# Patient Record
Sex: Female | Born: 1980 | Race: White | Hispanic: No | Marital: Married | Smoking: Former smoker
Health system: Southern US, Community
[De-identification: ages and names within clinical notes are randomized; demographics above are authoritative.]

## PROBLEM LIST (undated history)

## (undated) DIAGNOSIS — K8020 Calculus of gallbladder without cholecystitis without obstruction: Secondary | ICD-10-CM

## (undated) DIAGNOSIS — R109 Unspecified abdominal pain: Secondary | ICD-10-CM

## (undated) HISTORY — DX: Calculus of gallbladder without cholecystitis without obstruction: K80.20

## (undated) HISTORY — DX: Unspecified abdominal pain: R10.9

---

## 2000-10-30 ENCOUNTER — Other Ambulatory Visit: Admission: RE | Admit: 2000-10-30 | Discharge: 2000-10-30 | Payer: Self-pay | Admitting: Family Medicine

## 2003-01-15 ENCOUNTER — Other Ambulatory Visit: Admission: RE | Admit: 2003-01-15 | Discharge: 2003-01-15 | Payer: Self-pay | Admitting: Family Medicine

## 2004-04-28 ENCOUNTER — Other Ambulatory Visit: Admission: RE | Admit: 2004-04-28 | Discharge: 2004-04-28 | Payer: Self-pay | Admitting: Obstetrics and Gynecology

## 2005-11-01 ENCOUNTER — Other Ambulatory Visit: Admission: RE | Admit: 2005-11-01 | Discharge: 2005-11-01 | Payer: Self-pay | Admitting: Obstetrics and Gynecology

## 2006-05-23 ENCOUNTER — Inpatient Hospital Stay (HOSPITAL_COMMUNITY): Admission: AD | Admit: 2006-05-23 | Discharge: 2006-05-27 | Payer: Self-pay | Admitting: Obstetrics and Gynecology

## 2006-05-24 ENCOUNTER — Encounter (INDEPENDENT_AMBULATORY_CARE_PROVIDER_SITE_OTHER): Payer: Self-pay | Admitting: Specialist

## 2006-11-21 ENCOUNTER — Ambulatory Visit: Payer: Self-pay | Admitting: Family Medicine

## 2006-11-22 LAB — CONVERTED CEMR LAB
AST: 21 units/L (ref 0–37)
Albumin: 4.3 g/dL (ref 3.5–5.2)
Alkaline Phosphatase: 47 units/L (ref 39–117)
Basophils Absolute: 0 10*3/uL (ref 0.0–0.1)
Basophils Relative: 0.3 % (ref 0.0–1.0)
CO2: 27 meq/L (ref 19–32)
Chloride: 104 meq/L (ref 96–112)
Creatinine, Ser: 0.8 mg/dL (ref 0.4–1.2)
HCT: 37.3 % (ref 36.0–46.0)
Hemoglobin: 12.5 g/dL (ref 12.0–15.0)
Lipase: 20 units/L (ref 11.0–59.0)
MCHC: 33.6 g/dL (ref 30.0–36.0)
Monocytes Absolute: 0.1 10*3/uL — ABNORMAL LOW (ref 0.2–0.7)
Neutrophils Relative %: 70.6 % (ref 43.0–77.0)
Potassium: 4.1 meq/L (ref 3.5–5.1)
RBC: 4.54 M/uL (ref 3.87–5.11)
RDW: 20.4 % — ABNORMAL HIGH (ref 11.5–14.6)
Sodium: 137 meq/L (ref 135–145)
Total Bilirubin: 1 mg/dL (ref 0.3–1.2)
Total Protein: 7.3 g/dL (ref 6.0–8.3)
WBC: 11 10*3/uL — ABNORMAL HIGH (ref 4.5–10.5)

## 2008-05-11 ENCOUNTER — Inpatient Hospital Stay (HOSPITAL_COMMUNITY): Admission: RE | Admit: 2008-05-11 | Discharge: 2008-05-14 | Payer: Self-pay | Admitting: Obstetrics and Gynecology

## 2008-05-15 ENCOUNTER — Encounter: Admission: RE | Admit: 2008-05-15 | Discharge: 2008-06-16 | Payer: Self-pay | Admitting: Obstetrics and Gynecology

## 2009-08-02 ENCOUNTER — Ambulatory Visit: Payer: Self-pay | Admitting: Family Medicine

## 2009-08-02 DIAGNOSIS — R109 Unspecified abdominal pain: Secondary | ICD-10-CM | POA: Insufficient documentation

## 2009-08-03 LAB — CONVERTED CEMR LAB
Albumin: 4 g/dL (ref 3.5–5.2)
Basophils Absolute: 0 10*3/uL (ref 0.0–0.1)
CO2: 29 meq/L (ref 19–32)
Calcium: 9 mg/dL (ref 8.4–10.5)
Chloride: 104 meq/L (ref 96–112)
Eosinophils Absolute: 0.3 10*3/uL (ref 0.0–0.7)
HCT: 38.9 % (ref 36.0–46.0)
Hemoglobin: 13.4 g/dL (ref 12.0–15.0)
Lymphs Abs: 2.9 10*3/uL (ref 0.7–4.0)
MCHC: 34.4 g/dL (ref 30.0–36.0)
Monocytes Absolute: 0.9 10*3/uL (ref 0.1–1.0)
Neutro Abs: 3.3 10*3/uL (ref 1.4–7.7)
Platelets: 327 10*3/uL (ref 150.0–400.0)
Potassium: 3.9 meq/L (ref 3.5–5.1)
RDW: 11.9 % (ref 11.5–14.6)
Sodium: 140 meq/L (ref 135–145)
Total Bilirubin: 1.9 mg/dL — ABNORMAL HIGH (ref 0.3–1.2)

## 2009-08-05 ENCOUNTER — Encounter: Payer: Self-pay | Admitting: Family Medicine

## 2009-08-09 ENCOUNTER — Telehealth: Payer: Self-pay | Admitting: Family Medicine

## 2009-08-10 ENCOUNTER — Encounter: Payer: Self-pay | Admitting: Family Medicine

## 2009-08-11 ENCOUNTER — Encounter (INDEPENDENT_AMBULATORY_CARE_PROVIDER_SITE_OTHER): Payer: Self-pay | Admitting: *Deleted

## 2009-08-11 ENCOUNTER — Encounter: Payer: Self-pay | Admitting: Family Medicine

## 2009-09-08 ENCOUNTER — Ambulatory Visit: Payer: Self-pay | Admitting: Gastroenterology

## 2009-09-08 DIAGNOSIS — K8020 Calculus of gallbladder without cholecystitis without obstruction: Secondary | ICD-10-CM | POA: Insufficient documentation

## 2009-10-20 ENCOUNTER — Encounter: Payer: Self-pay | Admitting: Family Medicine

## 2010-10-25 NOTE — Consult Note (Signed)
Summary: [facility]   Imported By: Lanelle Bal 11/02/2009 11:51:57  _____________________________________________________________________  External Attachment:    Type:   Image     Comment:   External Document

## 2011-01-30 ENCOUNTER — Emergency Department (HOSPITAL_COMMUNITY): Payer: Self-pay

## 2011-01-30 ENCOUNTER — Emergency Department (HOSPITAL_COMMUNITY)
Admission: EM | Admit: 2011-01-30 | Discharge: 2011-01-30 | Disposition: A | Discharge status: Home / Self Care | Payer: Self-pay | Attending: Emergency Medicine | Admitting: Emergency Medicine

## 2011-01-30 DIAGNOSIS — R10816 Epigastric abdominal tenderness: Secondary | ICD-10-CM | POA: Insufficient documentation

## 2011-01-30 DIAGNOSIS — K2990 Gastroduodenitis, unspecified, without bleeding: Secondary | ICD-10-CM | POA: Insufficient documentation

## 2011-01-30 DIAGNOSIS — K2970 Gastritis, unspecified, without bleeding: Secondary | ICD-10-CM | POA: Insufficient documentation

## 2011-01-30 DIAGNOSIS — R1013 Epigastric pain: Secondary | ICD-10-CM | POA: Insufficient documentation

## 2011-01-30 DIAGNOSIS — R112 Nausea with vomiting, unspecified: Secondary | ICD-10-CM | POA: Insufficient documentation

## 2011-01-30 DIAGNOSIS — E039 Hypothyroidism, unspecified: Secondary | ICD-10-CM | POA: Insufficient documentation

## 2011-01-30 LAB — COMPREHENSIVE METABOLIC PANEL
Alkaline Phosphatase: 63 U/L (ref 39–117)
BUN: 8 mg/dL (ref 6–23)
Chloride: 101 mEq/L (ref 96–112)
GFR calc non Af Amer: >60 mL/min (ref >60)
Glucose, Bld: 104 mg/dL — ABNORMAL HIGH (ref 70–99)
Potassium: 3.9 mEq/L (ref 3.5–5.1)
Total Bilirubin: 0.3 mg/dL (ref 0.3–1.2)
Total Protein: 7.3 g/dL (ref 6.0–8.3)

## 2011-01-30 LAB — LIPASE, BLOOD: Lipase: 24 U/L (ref 11–59)

## 2011-01-30 LAB — CBC
HCT: 39.5 % (ref 36.0–46.0)
Hemoglobin: 13.2 g/dL (ref 12.0–15.0)
MCH: 30.8 pg (ref 26.0–34.0)
RBC: 4.28 MIL/uL (ref 3.87–5.11)

## 2011-01-30 LAB — DIFFERENTIAL
Basophils Relative: 0 % (ref 0–1)
Lymphocytes Relative: 28 % (ref 12–46)
Lymphs Abs: 4.4 10*3/uL — ABNORMAL HIGH (ref 0.7–4.0)
Monocytes Relative: 8 % (ref 3–12)
Neutro Abs: 9.4 10*3/uL — ABNORMAL HIGH (ref 1.7–7.7)
Neutrophils Relative %: 59 % (ref 43–77)

## 2011-02-07 NOTE — Op Note (Signed)
[name], [name]              ACCOUNT NO.:  [id number]   MEDICAL RECORD NO.:  [id number]          PATIENT TYPE:  INP   LOCATION:  9109                          FACILITY:  [facility]   PHYSICIAN:  Zenaida Niece, M.D.DATE OF BIRTH:  11/16/1980   DATE OF PROCEDURE:  05/11/2008  DATE OF DISCHARGE:                               OPERATIVE REPORT   PREOPERATIVE DIAGNOSIS:  Intrauterine pregnancy at 39 plus weeks,  previous cesarean section.   POSTOPERATIVE DIAGNOSIS:  Intrauterine pregnancy at 39 plus weeks,  previous cesarean section.   PROCEDURE:  Repeat low-transverse cesarean section without extensions.   SURGEON:  Zenaida Niece, MD   ASSISTANT:  Sherron Monday, MD   ANESTHESIA:  Spinal.   FINDINGS:  She had normal gravid anatomy and delivered a viable female  infant with Apgar's of 9 and 9, and weighed 8 pounds 11 ounces.   SPECIMENS:  Placenta sent for cord blood collection and to labor and  delivery.   ESTIMATED BLOOD LOSS:  800 mL.   COMPLICATIONS:  None.   PROCEDURE IN DETAIL:  The patient was taken to the operating room and  placed in the sitting position.  Dr. Pamalee Leyden instilled spinal anesthesia.  She was then placed in the dorsal supine position with a left lateral  tilt.  Abdomen was prepped and draped in the usual sterile fashion and a  Foley catheter was inserted.  Once the level of her anesthesia was found  to be adequate, abdomen was entered via a standard Pfannenstiel incision  through her previous transverse scar.  Once the peritoneum was entered  and the uterus was exposed, the Alexis disposable self-retaining  retractor was placed.  Good visualization was achieved.  A 4-cm  transverse incision was then made in the lower uterine segment pushing  the bladder inferior.  Once the uterine cavity was entered, the incision  was extended digitally.  Membranes ruptured during the incision  revealing clear fluid.  The fetal vertex was grasped and delivered  through  the incision atraumatically.  Mouth and nares were suctioned.  The remainder of the infant then delivered atraumatically.  Cord was  doubly clamped and cut and the infant handed to the awaiting pediatric  team.  Placenta delivered partially spontaneously, partially manually  and was sent for cord blood collection then to labor and delivery.  The  uterus was wiped dry with a clean lap pad and all clots and debris  removed.  Uterine incision was inspected and found to be free of  extensions.  The uterine incision was then closed in 2 layers with the  first layer being a running locking layer with #1 chromic and the second  layer being an imbricating layer also with #1 chromic.  Tubes and  ovaries were inspected and found to be normal.  The uterine incision was  again inspected and found to be hemostatic.  Bleeding from serosal edges  was controlled with electrocautery.  The subfascial space was then  irrigated and made hemostatic with electrocautery.  The fascia was  closed in a running fashion starting at both ends and meeting  in the  middle with 0 Vicryl.  Subcutaneous tissue was then irrigated and made  hemostatic with  electrocautery.  Subcutaneous tissue was closed with running 2-0 plain  gut suture.  The skin was then closed with staples followed by sterile  dressing.  The patient tolerated the procedure well.  She was taken to  the recovery room in stable condition.  Counts were correct x2 and she  received Ancef 2 g IV prior to incision.      Zenaida Niece, M.D.  Electronically Signed     TDM/MEDQ  D:  05/11/2008  T:  05/12/2008  Job:  [id number]

## 2011-02-07 NOTE — H&P (Signed)
[name], [name]              ACCOUNT NO.:  [id number]   MEDICAL RECORD NO.:  [id number]         PATIENT TYPE:  WINP   LOCATION:                                [facility]  [facility]   PHYSICIAN:  Zenaida Niece, M.D.DATE OF BIRTH:  1981/08/26   DATE OF ADMISSION:  05/11/2008  DATE OF DISCHARGE:                              HISTORY & PHYSICAL   CHIEF COMPLAINT:  Intrauterine pregnancy for repeat cesarean section.   HISTORY OF PRESENT ILLNESS:  This is a 30 year old white female gravida  2, para 1-0-0-1 with an EGA of [redacted] weeks by an LMP consistent with a 9  week ultrasound with a due date of August 19 who is presenting for  repeat cesarean section.  First delivery was via primary cesarean  section at 40 weeks for arrest of descent.  She is cleared for a trial  of labor.  She has signed a VBAC consent form in case she goes into  labor but if she makes it to the 17th she does want to proceed with  repeat cesarean section.  Prenatal care has been otherwise essentially  uncomplicated.   PRENATAL LABS:  Blood type is A+ with a negative antibody screen.  RPR  nonreactive, rubella immune, hepatitis B surface antigen negative, HIV  negative, gonorrhea and chlamydia negative.  Cystic fibrosis negative.  Quad screen is normal.  One hour Glucola is 117.  Group B strep is  negative.   PAST OB HISTORY:  In 2007 low transverse cesarean section by Dr. Ambrose Mantle  at 40 weeks for arrest of descent.  The baby weighed 8 pounds 14 ounces.   PAST MEDICAL HISTORY:  History of depression when she was younger with  no recent medications.   PAST SURGICAL HISTORY:  Only significant for the cesarean section.   ALLERGIES:  MINOCYCLINE.   CURRENT MEDICATIONS:  None.   SOCIAL HISTORY:  She is married and denies alcohol, tobacco or drug use.   FAMILY HISTORY:  Is noncontributory.   REVIEW OF SYSTEMS:  Normal complaints of pregnancy.   HISTORY AND PHYSICAL:  VITAL SIGNS:  Weight is 190 pounds, blood  pressure is 110/60.  GENERAL:  This is a well-developed gravid female in no acute distress.  NECK:  Neck is supple without lymphadenopathy or thyromegaly.  LUNGS:  Clear to auscultation.  HEART:  Regular rate and rhythm without murmur.  ABDOMEN:  Is gravid with a fundal height of 40 cm, nontender and a well-  healed transverse scar.  EXTREMITIES:  1+ edema and are nontender.  CERVIX:  Is 3, 40, -2 to -3 vertex presentation and an adequate pelvis.   ASSESSMENT:  Intrauterine pregnancy at 39 weeks with previous cesarean  section.  The patient is willing to have a VBAC if she goes into labor  but if she makes it to the 17th she wants to proceed with repeat  cesarean section.  All risks of surgery have been discussed with the  patient and she understands.  The plan is to admit the patient on August  17 for repeat cesarean section.      Tawanna Cooler  D. Meisinger, M.D.  Electronically Signed     TDM/MEDQ  D:  05/09/2008  T:  05/09/2008  Job:  [id number]

## 2011-02-07 NOTE — Discharge Summary (Signed)
[name], [name]              ACCOUNT NO.:  [id number]   MEDICAL RECORD NO.:  [id number]          PATIENT TYPE:  INP   LOCATION:  9109                          [facility]   PHYSICIAN:  Zenaida Niece, M.D.DATE OF BIRTH:  12-31-1980   DATE OF ADMISSION:  05/11/2008  DATE OF DISCHARGE:  05/14/2008                               DISCHARGE SUMMARY   ADMISSION DIAGNOSES:  1. Intrauterine pregnancy at 39 weeks.  2. Previous cesarean section.   DISCHARGE DIAGNOSES:  1. Intrauterine pregnancy at 39 weeks.  2. Previous cesarean section.   PROCEDURES:  On May 11, 2008, she had a repeat low transverse  cesarean section.   HISTORY AND PHYSICAL:  Please see chart for full history and physical.  Briefly, this is a 31 year old white female gravida 2, para 1-0-0-1 with  an EGA of [redacted] weeks who presents for repeat cesarean section.  Prenatal  care was uncomplicated.  Past OB history is significant for a low  transverse cesarean section for arrest of descent.  Physical exam is  significant for a gravid abdomen with a well-healed transverse scar and  cervix is 3, 40, -2 to -3 with vertex presentation.   HOSPITAL COURSE:  The patient was admitted on the day of surgery and  underwent repeat low transverse cesarean section under spinal  anesthesia.  She had normal anatomy and delivered a viable female infant  with Apgars of 9 and 9, weight 8 pounds 11 ounces.  Blood loss was 800  mL.  Postoperatively, she had no significant complications.  Pre-  delivery hemoglobin 10.6, postdelivery was 9.8.  On postoperative #3,  she was felt to be stable enough for discharge home.  Prior to discharge  her staples were removed and Steri-Strips applied.   DISCHARGE INSTRUCTIONS:  Regular diet, pelvic rest, no strenuous  activity.  Followup is in 2 weeks for an incision check.  Medications  are Percocet #40 1-2 p.o. q.4-6 hours p.r.n. pain and over-the-counter  ibuprofen as needed and she is given our  discharge pamphlet.      Zenaida Niece, M.D.  Electronically Signed     TDM/MEDQ  D:  05/14/2008  T:  05/14/2008  Job:  [id number]

## 2011-02-08 ENCOUNTER — Encounter: Payer: Self-pay | Admitting: Family Medicine

## 2011-02-10 ENCOUNTER — Ambulatory Visit (INDEPENDENT_AMBULATORY_CARE_PROVIDER_SITE_OTHER): Payer: Self-pay | Admitting: Family Medicine

## 2011-02-10 VITALS — BP 100/70 | HR 71 | Temp 97.7°F | Ht 67.0 in | Wt 194.8 lb

## 2011-02-10 DIAGNOSIS — R109 Unspecified abdominal pain: Secondary | ICD-10-CM

## 2011-02-10 DIAGNOSIS — K8020 Calculus of gallbladder without cholecystitis without obstruction: Secondary | ICD-10-CM

## 2011-02-10 NOTE — Assessment & Plan Note (Signed)
[facility]                                 ON-CALL NOTE   NAME:[name], [name]                       MRN:          [id number]  DATE:07/11/2007                            DOB:          03-01-1981    TIME:  5:23 p.m.   PHONE NUMBER:  [id number].   OBJECTIVE:  Patient had a sore throat all day.  Had some bumps on the  back of her tongue, was wondering what they might be.  Her throat is not  terribly sore.  She sounds fine.   ASSESSMENT:  Upper respiratory infection.   PLAN:  Suggest she gargle as much as possible with salt water.  She is  seeing her taste buds, presumably at the back of her tongue.  Use  Tylenol regularly as needed and drinks lots of fluids.  Call Monday if  no better.   PRIMARY CARE PHYSICIAN:  Kerby Nora, M.D.   HOME OFFICE:  [place].     Arta Silence, MD  Electronically Signed    RNS/MedQ  DD: 07/11/2007  DT: 07/12/2007  Job #: [id number]

## 2011-02-10 NOTE — Assessment & Plan Note (Signed)
[facility]                           [place] OFFICE NOTE   NAME:[name], [name]                     MRN:          [id number]  DATE:11/21/2006                            DOB:          12-Oct-1980    CHIEF COMPLAINT:  A 30 year old white female here to reestablish.   HISTORY OF PRESENT ILLNESS:  Ms. Rowley was last seen at our clinic in  2004 by Dr. Milinda Antis.  In the meantime she has been cared for by her  gynecologist following a delivery of her baby in August 2007 via C-  section.  She comes to clinic today with the following concerns:  1. Irritability:  She states that over the last 2 months since she      began staying at home taking care of her child full-time she has      had an increase of frustration, irritability, fatigue, decreased      energy, and anhedonia.  She states that she does feel sad but is      not tearful.  She states she is having difficulty with the      decreased interaction between herself and adults, including her      husband.  She has no desire to get out and exercise or be active in      children's groups.  She does have a history of depression in 2004      and was treated with Prozac for a year.  She states that she thinks      this medication helped some and does not remember any specific side      effects.  She denies suicidal or homicidal ideation.  She denies      previous hospitalizations for psychiatric issues.  She denies manic      symptoms, hallucinations.  The patient states that her symptoms are      worse 2 weeks prior to her menstrual cycle and then seem to be      relieved somewhat with her menses.  2. Epigastric abdominal pain, chronic:  She states that for about 6      months she has been having pain in her epigastric abdominal area      that radiates through to her back.  It has gradually been      increasing frequency to occurring about once every 2 weeks.  When      she experiences the pain, it  seems to start after fatty foods.  Her      last episode was 5 days ago.  It ranges between 8 and 10 on a scale      of 10.  She also has nausea.  She denies vomiting, constipation,      diarrhea, blood in her stools, fever and chills.  She mentioned her      symptoms to her gynecologist, but at that time it had seemed to be      more of back pain.  She was given Darvocet which she uses for the      pain which helps dull the ache some.  She  denies muscle weakness,      numbness, or low back pain.   REVIEW OF SYSTEMS:  Otherwise negative.   PAST MEDICAL HISTORY:  1. Acne.  2. Depression.   HOSPITALIZATIONS, SURGERIES, PROCEDURES:  August 2007 - C-section.   ALLERGIES:  1. MINOCYCLINE.  2. SEPTRA.   MEDICATIONS:  1. Iron supplement.  2. Prenatal vitamins.  3. Darvocet p.r.n.   SOCIAL HISTORY:  She has a former history of smoking, 10 pack-years.  She does not drink alcohol.  She does not use drugs.  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  She  has been married since April 2007.  She denies any verbal or physical  abuse.  She has one child.  She does not get regular exercise.  She  tries to eat three meals per day including fruits and vegetables, no  fast food, and occasional water.   FAMILY HISTORY:  Father alive at age 78 but this is her adopted father.  Mother alive at age 62 with hypothyroidism.  No family history of heart  attack before age 43.  She has two half-brothers who are healthy.  There  is no family history of cancer in the family as far as she knows.   PHYSICAL EXAMINATION:  VITAL SIGNS:  Height 67 inches, weight 148,  making BMI 21.  Blood pressure 102/60, pulse 76, temperature 98.1.  GENERAL:  Healthy-appearing female in no apparent distress.  PSYCHIATRIC:  Appropriate affect.  No suicidal or homicidal ideation.  Good judgment.  HEENT:  PERRLA, extraocular muscles intact, oropharynx clear, tympanic  membranes  clear, nares clear.  No thyromegaly, no lymphadenopathy  supraclavicular or cervical.  CARDIOVASCULAR:  Regular rate and rhythm.  No murmurs, rubs or gallops.  Normal PMI, 2+ peripheral pulses, no peripheral edema.  LUNGS:  Clear to auscultation bilaterally.  No wheezes, rales, or  rhonchi.  ABDOMEN:  Soft, tender to palpitation in right upper quadrant and  epigastric abdomen.  No rebound, no guarding.  SKIN:  No rash.  MUSCULOSKELETAL:  Strength 5/5 in upper and lower extremities.  BACK:  Nontender to palpation.  Full range of motion in back, flexion  and extension, as well as lateral rotation.  NEUROLOGIC:  Cranial nerves II-XII grossly intact.  Sensation intact in  upper and lower extremities.  Patellar reflexes 2+ bilaterally.   ASSESSMENT AND PLAN:  1. Minor depression versus premenstrual dysphoric disorder:  We      discussed the benefits of becoming more active and being involved      in activities with adults outside of the house.  I also encouraged      her to get exercise and have time for herself.  I offered a      therapist, which she refused at this point in time.  I will check a      thyroid, given her mother's history of hypothyroidism.  She will      begin sertraline 50 mg daily and will return in 3-4 weeks for      followup.  2. Epigastric abdominal pain, chronic:  This may be secondary to      gastritis versus pancreatitis versus gallbladder disease.  Will      begin by checking LFTs and a lipase.  If those are negative, we      will move on to getting a right upper quadrant ultrasound to      evaluate her  gallbladder.  She was given prescription for Darvocet #15 with zero      refills.  She was told      that if her pain lasts more than 3 hours or if she has fever,      nausea, and vomiting with it to go to the emergency room.     Kerby Nora, MD  Electronically Signed   AB/MedQ  DD: 11/22/2006  DT: 11/22/2006  Job #: [id number]

## 2011-02-10 NOTE — H&P (Signed)
[name], [name]              ACCOUNT NO.:  [id number]   MEDICAL RECORD NO.:  [id number]          PATIENT TYPE:  INP   LOCATION:  9169                          [facility]ACILITY:  [facility]   PHYSICIAN:  Malachi Pro. Ambrose Mantle, M.D. DATE OF BIRTH:  [birth year]   DATE OF ADMISSION:  05/23/2006  DATE OF DISCHARGE:                                HISTORY & PHYSICAL   HISTORY OF PRESENT ILLNESS:  [age]-year-old white female, para 0,  gravida 1, EDC of May 22, 2006, admitted with rupture of membranes at 2  p.m. on May 23, 2006.  Blood group and type A positive, negative  antibody.  Nonreactive serology.  Rubella immune.  Hepatitis B surface  antigen negative.  HIV negative.  GC and Chlamydia negative.  Quad screen  normal.  One-hour Glucola 63, three-hour GTT 71, 126, 126 and 114.  Group B  strep negative.  Vaginal ultrasound on October 18, 2005:  Crown-rump length  2.44 cm, 9 weeks 1 day, [facility] May 22, 2006.  Ultrasound on December 12, 2005:  Size compatible with dates.  Placenta previa was seen.  Ultrasound on March 26, 2006 showed resolution of the placenta previa.  The patient had  spontaneous rupture of membranes at 2 p.m. on the day of admission.  She was  admitted and placed on Pitocin.  At 6:56 p.m., the contractions were every 3  minutes on 7 mU/min of Pitocin.   ALLERGIES:  MINOCYCLINE.   PAST MEDICAL HISTORY:   OPERATIONS:  None.   ILLNESSES:  Depression.   SOCIAL HISTORY:  Alcohol, tobacco and drugs:  None.   FAMILY HISTORY:  Mother with thyroid dysfunction.  Paternal grandfather with  pulmonary disease and paternal grandmother with diabetes.   PHYSICAL EXAMINATION:  VITAL SIGNS:  Exam on admission revealed normal vital  signs.  HEART:  Normal.  LUNGS:  Normal.  ABDOMEN:  Soft, fundal height at 38 cm on May 16, 2006.  Fetal heart  tones were normal.  PELVIC:  Cervix 3-4 cm, 80% to 90%, vertex at a -2.   COURSE OF LABOR AND PLAN OF TREATMENT:  The patient had rupture  of  membranes, but membranes present in front of the head, so an artificial  rupture of membranes was done with clear fluid.  Pitocin was continued; at  10:07 p.m., the Pitocin was at 8 mU/min, contractions every 2-3 minutes.  The cervix was 8 cm.  The patient had an epidural.  At 11:39 p.m., the  patient was fully dilated with the vertex OP and vertex at a 0 station in  the occiput-posterior position.  She had become fully dilated at 11:30.  By  2 a.m., she had pushed hard, but she was basically exhausted and was making  poor pushing efforts.  There was a small amount of hair visible and I called  to be  prepared for a C-section, to try a vacuum or forceps delivery, but the  operating room was setting up for another C-section, so I had to call the  second team to proceed to the OR when they are available to try a  vacuum or  forceps and if no descent of the head, to proceed with C-section.      Malachi Pro. Ambrose Mantle, M.D.  Electronically Signed     TFH/MEDQ  D:  05/24/2006  T:  05/24/2006  Job:  [id number]

## 2011-02-10 NOTE — Op Note (Signed)
[name], [name]              ACCOUNT NO.:  [id number]   MEDICAL RECORD NO.:  [id number]          PATIENT TYPE:  INP   LOCATION:  9131                          [facility]   PHYSICIAN:  Malachi Pro. Ambrose Mantle, M.D. DATE OF BIRTH:  10-25-1980   DATE OF PROCEDURE:  05/24/2006  DATE OF DISCHARGE:                                 OPERATIVE REPORT   PREOPERATIVE DIAGNOSIS:  Intrauterine pregnancy 40 weeks, relative  cephalopelvic disproportion.   POSTOPERATIVE DIAGNOSIS:  Intrauterine pregnancy 40 weeks, relative  cephalopelvic disproportion.   OPERATION:  Low transverse cervical cesarean section, oversew of bleeders  from the posterior uterine wall.   OPERATOR:  Malachi Pro. Ambrose Mantle, M.D.   ANESTHESIA:  Epidural anesthesia.   The patient was brought to the operating room and the plan had been to try a  vacuum and, if unsuccessful, proceed with C-section.  The patient had quit  pushing and had lost an inch or two of station of the fetal vertex so we  went straight ahead to C-section.  The patient was brought to the operating  room and the epidural anesthetic was boosted. A Foley catheter was  indwelling. The abdomen was prepped with Betadine solution and draped as a  sterile field. A transverse incision was made and carried in layers through  the skin, subcutaneous tissue, and fascia.  The fascia was separated from  the rectus muscles superiorly and inferiorly.  The rectus muscle was split  in the midline.  The peritoneum was opened vertically.  The bladder was  retracted away and an incision was made into the lower uterine segment  myometrium and then I went the rest of the way with my finger. Clear fluid  was obtained.  There was an abundance of fluid.  I incised the myometrium in  an upward and outward direction both ways.  I then easily delivered the  vertex which was in an occiput posterior position through the incisional  opening. The remainder of the baby was delivered.  The  nose and pharynx was  suctioned with the bulb.  The cord was clamped and the infant was given to  the neonatologist who was in attendance.  It was an 8 pounds 14 ounce  female, given Apgars of 8 at 1 minute and 8 at 5 minutes.  The placenta was  removed after cord blood was obtained and the placenta was preserved for the  cord blood bank people to try to get as much blood as possible. At this  point, the inside of the uterus was inspected, found to be free of debris,  but there was significant bleeding from the posterior uterine wall.  This  was controlled with a combination of 0 Vicryl sutures and 3-0 Vicryl  sutures.  Finally, it seemed to be reasonably controlled and I closed the  uterine incision with two running sutures of 0 Vicryl, locking the first  layer, nonlocking the second layer.  The uterus, both tubes and ovaries  appeared normal.  Liberal irrigation confirmed hemostasis.  Reperitonealization of the bladder flap was not done.  The abdominal wall  was  then closed in layers using interrupted sutures of 0 Vicryl on the  rectus muscle, one of which included the peritoneum, two running sutures of  0 Vicryl on the fascia, running 3-0 Vicryl on the subcu tissue, staples on  the skin.  The patient seemed to tolerate  procedure well.  Blood loss was estimated by the anesthetist at 1200 to 1500  mL. Most of this bleeding came from the posterior uterine wall and the  uterine atony.  The atony was controlled as well as could be with Pitocin,  Methergine and vigorous massage by me.  The patient was returned to recovery  in satisfactory condition.      Malachi Pro. Ambrose Mantle, M.D.  Electronically Signed     TFH/MEDQ  D:  05/24/2006  T:  05/24/2006  Job:  [id number]

## 2011-02-10 NOTE — Patient Instructions (Signed)
Please stop by to see [name] on your way out. 

## 2011-02-10 NOTE — Discharge Summary (Signed)
[name], [name]              ACCOUNT NO.:  [id number]   MEDICAL RECORD NO.:  [id number]          PATIENT TYPE:  INP   LOCATION:  9131                          FACILITY:  [facility]   PHYSICIAN:  Zenaida Niece, M.D.DATE OF BIRTH:  [birth year]   DATE OF ADMISSION:  05/23/2006  DATE OF DISCHARGE:  05/27/2006                                 DISCHARGE SUMMARY   ADMISSION DIAGNOSIS:  Intrauterine pregnancy at 40 weeks, premature rupture  of membranes.   DISCHARGE DIAGNOSIS:  Intrauterine pregnancy at 40 weeks, premature rupture  of membranes, relative CPD.   PROCEDURES:  On August 30, she had a primary low transverse cesarean  section.   HISTORY AND PHYSICAL:  Please see chart for full history and physical.  Briefly, this is a 30 year old white female gravida 1, para 0 with an EGA of  [redacted] weeks who presented to the office with the complaint of spontaneous  rupture of membranes at 1400 on the day of admission.  Evaluation in the  office confirmed rupture of membranes, and she was, I believe, 1 cm dilated.  Prenatal care essentially uncomplicated with a placenta previa early on  ultrasound that resolved.   PRENATAL LABORATORY DATA:  Blood type is A+ with a negative antibody screen,  RPR nonreactive, rubella immune, hepatitis B surface antigen negative, HIV  negative, and gonorrhea and chlamydia negative.  Quad screen was normal.  One-hour Glucola 163, 3-hour GTT 71, 126, 125 and 114.  Group B strep is  negative.   PAST HISTORY:  Essentially uncomplicated.   PHYSICAL EXAMINATION:  VITAL SIGNS:  She was afebrile with stable vital  signs.  Fetal heart tracing was normal.  ABDOMEN:  Gravid with fundal height  of 38 cm.  PELVIC:  Cervix was 3 to 4, 80 to 90, -2, vertex per Dr. Ambrose Mantle, and she did  have a forebag which he did rupture.   HOSPITAL COURSE:  The patient was admitted and started on Pitocin.  Dr.  Ambrose Mantle then ruptured her forebag.  She progressed to complete and pushed for  approximately 2 and half hours but despite this was unable to deliver  vaginally.  Thus, on the morning of August30, Dr. Ambrose Mantle performed a primary  low transverse cesarean section for arrest of descent.  This was done under  epidural anesthesia with an estimated blood loss of 1200-1500 mL.  She  delivered a viable female infant with a weight 8 pounds 14 ounces.  Postoperatively, she did well except for some anemia.  Pre-delivery  hemoglobin 11.4, post-delivery is 7.3.  She was able to ambulate without  signs of hypotension.  She had no other significant complications.  On the  morning of postoperative #3, she was felt to be stable enough for discharge  home.  Her incision was healing well, and prior to discharge, her staples  were to be removed and Steri-Strips applied.   DISCHARGE INSTRUCTIONS:  Regular diet, pelvic rest, no strenuous activity.  Follow-up is in 2 weeks.  Medications are Percocet #30 one to two p.o. q.4-  6h. p.r.n. pain and over-the-counter ibuprofen as needed, and she  was also  take over-the-counter iron b.i.d., and she was given our discharge pamphlet.      Zenaida Niece, M.D.  Electronically Signed     TDM/MEDQ  D:  05/27/2006  T:  05/28/2006  Job:  [id number]

## 2011-02-10 NOTE — Progress Notes (Signed)
30 -year-old female here for follow up of persistent abdominal pain.  Last saw GI in 08/2009, Dr. Christella Hartigan for this discomfort.  She has been having intermittent abd pains, past 1 1/2 years. The pains can last for 5-10 minutes.  Pretty intense pain, like a stabbing pain, cramping pain.  The pains do radiate a bit to her back.  No nausea or vomiting.  No fevers or chills.  The pains are usually NOT after eating.  Can occur with stress but not always.  Abdominal U/S in 07/2009 showed gallstones without any signs of cholecystitis or gallbladder wall thickening. Her bile duct was normal caliber. She also had complete metabolic profile, CBC these were normal except for a elevated indirect bilirubin.  According to Dr. Christella Hartigan' note, symptoms were in fact likely consistent with atypical biliary colic (pos FH of gallstones), and was referred for surgery consultation. Notes reviewed. Saw Dr. Michaell Cowing with [facility] in 09/2009. Cholecystectomy was offered.  Most recently, pain has worsened.   Now pain is more located to epigastric region, 10/10 and episodic. Associated with no other symptoms.  Not worsened or aggravated by food.  Went to ER on 01/30/2011. Notes reviewed. WBC elevated at 15.8, LFTs and BMET unremarkable. Abdominal ultrasound consistent with ultrasound in 07/2009- cholelithiasis without evidence of cholecystis. Given GI cocktail, symptoms improved and referred back to GI. Advised to take Prilosec and Pepcid, both which are helping a little with symptoms. Darrelle is here today because GI did not have any appointments available until next month.   The PMH, PSH, Social History, Family History, Medications, and allergies have been reviewed in [facility], and have been updated if relevant.   Review of Systems        Pertinent positive and negative review of systems were noted in the above HPI and GI specific review of systems.  All other review of systems was otherwise negative.  Physical  Exam  Constitutional: generally well appearing Psychiatric: alert and oriented times 3 Eyes: extraocular movements intact Mouth: oropharynx moist, no lesions Neck: supple, no lymphadenopathy Cardiovascular: heart regular rate and rythm Lungs: CTA bilaterally Abdomen: soft, non-tender, non-distended, no obvious ascites, no peritoneal signs, normal bowel sounds Extremities: no lower extremity edema bilaterally Skin: no lesions on visible extremities  A/P- Abdominal/epigastric pain- With known h/o gallstones. Pain did improve with GI cocktail and PPI/H2 blockers. Exam unremarkable. Discussed options with Katheline.  At this point, she can either return to GI for further work up/endoscopy or go ahead with the elective cholecystectomy with the understanding that it may not improve her symptoms. She prefers to return to GI. Referral placed, she would like to go to [facility].

## 2011-02-28 ENCOUNTER — Telehealth: Payer: Self-pay | Admitting: *Deleted

## 2011-02-28 MED ORDER — LEVOTHYROXINE SODIUM 50 MCG PO TABS: 50.0000 ug | ORAL_TABLET | Freq: Every day | ORAL | Status: DC

## 2011-02-28 NOTE — Telephone Encounter (Signed)
Patient is asking if she could come in for lab work. She says that she needs her thyroid checked because she needs her synthroid filled. She says that she has no insurance and can't really afford an office visit. Please advise.

## 2011-02-28 NOTE — Telephone Encounter (Signed)
Patient notified as instructed by telephone.Medication phoned to [facility] as instructed.  

## 2011-02-28 NOTE — Telephone Encounter (Signed)
Unfortunately - I am not allowed to treat her thyroid condition without at least seeing her in office once a year for exam and labs I am happy to refil her med short term until she can get into lower cost clinic (have [facility] out [facility] in [place]-- they may be a lot cheaper because they cater to those without insurance)  Then she can return here once she gets insurance Px written for call in  - for 2 months

## 2011-03-06 ENCOUNTER — Telehealth: Payer: Self-pay | Admitting: Gastroenterology

## 2011-03-06 NOTE — Telephone Encounter (Signed)
noted 

## 2011-03-08 ENCOUNTER — Ambulatory Visit: Payer: Self-pay | Admitting: Gastroenterology

## 2011-03-13 ENCOUNTER — Other Ambulatory Visit: Payer: Self-pay | Admitting: Gastroenterology

## 2011-03-30 ENCOUNTER — Encounter (INDEPENDENT_AMBULATORY_CARE_PROVIDER_SITE_OTHER): Payer: BC Managed Care – PPO | Admitting: General Surgery

## 2011-06-07 ENCOUNTER — Other Ambulatory Visit: Payer: Self-pay | Admitting: *Deleted

## 2011-06-07 MED ORDER — LEVOTHYROXINE SODIUM 50 MCG PO TABS: 50.0000 ug | ORAL_TABLET | Freq: Every day | ORAL | Status: DC

## 2011-06-08 ENCOUNTER — Other Ambulatory Visit: Payer: Self-pay | Admitting: *Deleted

## 2011-06-08 MED ORDER — LEVOTHYROXINE SODIUM 50 MCG PO TABS: 50.0000 ug | ORAL_TABLET | Freq: Every day | ORAL | Status: AC

## 2011-06-26 HISTORY — PX: CHOLECYSTECTOMY: SHX55

## 2011-06-28 ENCOUNTER — Inpatient Hospital Stay (HOSPITAL_COMMUNITY)
Admission: EM | Admit: 2011-06-28 | Discharge: 2011-07-01 | DRG: 419 | Disposition: A | Discharge status: Home / Self Care | Payer: Medicaid Other | Attending: General Surgery | Admitting: General Surgery

## 2011-06-28 DIAGNOSIS — K219 Gastro-esophageal reflux disease without esophagitis: Secondary | ICD-10-CM | POA: Diagnosis present

## 2011-06-28 DIAGNOSIS — F17200 Nicotine dependence, unspecified, uncomplicated: Secondary | ICD-10-CM | POA: Diagnosis present

## 2011-06-28 DIAGNOSIS — E039 Hypothyroidism, unspecified: Secondary | ICD-10-CM | POA: Diagnosis present

## 2011-06-28 DIAGNOSIS — K8010 Calculus of gallbladder with chronic cholecystitis without obstruction: Principal | ICD-10-CM | POA: Diagnosis present

## 2011-06-29 ENCOUNTER — Emergency Department (HOSPITAL_COMMUNITY): Payer: Medicaid Other

## 2011-06-29 DIAGNOSIS — K8010 Calculus of gallbladder with chronic cholecystitis without obstruction: Secondary | ICD-10-CM

## 2011-06-29 LAB — POCT I-STAT, CHEM 8
BUN: 9 mg/dL (ref 6–23)
Calcium, Ion: 1.21 mmol/L (ref 1.12–1.32)
Chloride: 104 mEq/L (ref 96–112)
Creatinine, Ser: 0.9 mg/dL (ref 0.50–1.10)
Glucose, Bld: 98 mg/dL (ref 70–99)
HCT: 46 % (ref 36.0–46.0)
Hemoglobin: 15.6 g/dL — ABNORMAL HIGH (ref 12.0–15.0)
Potassium: 3.8 mEq/L (ref 3.5–5.1)
Sodium: 137 mEq/L (ref 135–145)
TCO2: 25 mmol/L (ref 0–100)

## 2011-06-29 LAB — HEPATIC FUNCTION PANEL
Albumin: 4.6 g/dL (ref 3.5–5.2)
Alkaline Phosphatase: 70 U/L (ref 39–117)
Bilirubin, Direct: 0.1 mg/dL (ref 0.0–0.3)
Total Bilirubin: 0.5 mg/dL (ref 0.3–1.2)

## 2011-06-29 LAB — URINALYSIS, ROUTINE W REFLEX MICROSCOPIC
Bilirubin Urine: NEGATIVE
Glucose, UA: NEGATIVE mg/dL
Ketones, ur: NEGATIVE mg/dL
Nitrite: NEGATIVE
Protein, ur: NEGATIVE mg/dL
pH: 6 (ref 5.0–8.0)

## 2011-06-29 LAB — CBC
Platelets: 449 10*3/uL — ABNORMAL HIGH (ref 150–400)
RDW: 12.5 % (ref 11.5–15.5)
WBC: 18 10*3/uL — ABNORMAL HIGH (ref 4.0–10.5)

## 2011-06-29 LAB — POCT PREGNANCY, URINE: Preg Test, Ur: NEGATIVE

## 2011-06-29 LAB — LIPASE, BLOOD: Lipase: 30 U/L (ref 11–59)

## 2011-06-29 LAB — SODIUM, URINE, RANDOM: Sodium, Ur: 65 mEq/L

## 2011-06-29 LAB — URINE MICROSCOPIC-ADD ON

## 2011-06-29 LAB — DIFFERENTIAL
Basophils Absolute: 0.1 10*3/uL (ref 0.0–0.1)
Eosinophils Absolute: 0.4 10*3/uL (ref 0.0–0.7)
Eosinophils Relative: 2 % (ref 0–5)

## 2011-06-30 ENCOUNTER — Other Ambulatory Visit (INDEPENDENT_AMBULATORY_CARE_PROVIDER_SITE_OTHER): Payer: Self-pay | Admitting: General Surgery

## 2011-06-30 ENCOUNTER — Inpatient Hospital Stay (HOSPITAL_COMMUNITY): Payer: Medicaid Other

## 2011-06-30 LAB — COMPREHENSIVE METABOLIC PANEL
ALT: 10 U/L (ref 0–35)
AST: 13 U/L (ref 0–37)
Albumin: 3.3 g/dL — ABNORMAL LOW (ref 3.5–5.2)
Alkaline Phosphatase: 54 U/L (ref 39–117)
Chloride: 104 mEq/L (ref 96–112)
Potassium: 3.7 mEq/L (ref 3.5–5.1)
Total Bilirubin: 1.3 mg/dL — ABNORMAL HIGH (ref 0.3–1.2)

## 2011-06-30 LAB — CBC
Platelets: 332 10*3/uL (ref 150–400)
RDW: 12.9 % (ref 11.5–15.5)
WBC: 11.6 10*3/uL — ABNORMAL HIGH (ref 4.0–10.5)

## 2011-07-04 ENCOUNTER — Telehealth (INDEPENDENT_AMBULATORY_CARE_PROVIDER_SITE_OTHER): Payer: Self-pay | Admitting: General Surgery

## 2011-07-04 MED ORDER — HYDROCODONE-ACETAMINOPHEN 5-325 MG PO TABS
1.0000 | ORAL_TABLET | Freq: Four times a day (QID) | ORAL | Status: DC | PRN
Start: 2011-07-04 — End: 2011-07-12

## 2011-07-04 NOTE — Telephone Encounter (Signed)
Patient called s/p lap chole stating that she is still having some pain but not bad enough that she feels she needs the percocet she had been taking. Per automatic refill protocol I [facility]. Patient will call with any questions.

## 2011-07-06 ENCOUNTER — Telehealth (INDEPENDENT_AMBULATORY_CARE_PROVIDER_SITE_OTHER): Payer: Self-pay | Admitting: General Surgery

## 2011-07-06 NOTE — Telephone Encounter (Signed)
I CALLED PT TO SCHEDULE F/U VISIT AFTER LAPAROSCOPIC CHOLECYSTECTOMY

## 2011-07-06 NOTE — Telephone Encounter (Signed)
Message copied by Lannie Fields on Thu Jul 06, 2011  2:56 PM ------      Message from: Cathi Roan      Created: Tue Jul 04, 2011  3:24 PM      Regarding: Post Op Appt       Patient had Lap Chole by Dr. Carolynne Edouard on 06/30/11 called for Post Op Appt could not find in normal time range. Patient left cell # [phone]

## 2011-07-11 NOTE — Telephone Encounter (Signed)
Sounds ok

## 2011-07-12 ENCOUNTER — Telehealth (INDEPENDENT_AMBULATORY_CARE_PROVIDER_SITE_OTHER): Payer: Self-pay

## 2011-07-12 DIAGNOSIS — G8918 Other acute postprocedural pain: Secondary | ICD-10-CM

## 2011-07-12 MED ORDER — HYDROCODONE-ACETAMINOPHEN 5-325 MG PO TABS: 1.0000 | ORAL_TABLET | Freq: Four times a day (QID) | ORAL | Status: AC | PRN

## 2011-07-12 NOTE — Telephone Encounter (Signed)
I received a request from the pharmacy for refill on Norco 5-325 #30 no refills take 1 tablet by mouth every 6 hours as needed for pain. I called this in [facility] Dr [phone]

## 2011-07-14 NOTE — Op Note (Signed)
NAMEANALENA, GAMA              ACCOUNT NO.:  [id number]  MEDICAL RECORD NO.:  [id number]  LOCATION:  1537                         FACILITY:  [facility]  PHYSICIAN:  Ollen Gross. Vernell Morgans, M.D. DATE OF BIRTH:  1980/11/06  DATE OF PROCEDURE:  06/30/2011 DATE OF DISCHARGE:                              OPERATIVE REPORT   PREOPERATIVE DIAGNOSIS:  Gallstones.  POSTOPERATIVE DIAGNOSIS:  Gallstones.  PROCEDURE:  Laparoscopic cholecystectomy with intraoperative cholangiogram.  SURGEON:  Ollen Gross. Vernell Morgans, M.D.  ANESTHESIA:  General endotracheal.  PROCEDURE IN DETAIL:  After informed consent was obtained, the patient was brought to the operating room, placed in supine position on the operating table.  After adequate induction of general anesthesia, the patient's abdomen was prepped with ChloraPrep, allowed to dry and draped in usual sterile manner.  The area below the umbilicus was infiltrated with 0.25% Marcaine.  A small incision was made with a 15 blade knife. This incision was carried down through the subcutaneous tissue bluntly with hemostat and Army-Navy retractors until the linea alba was identified.  The linea alba was incised with a 15 blade knife.  Each side was grasped with Kocher clamps and elevated anteriorly.  The preperitoneal space was then probed bluntly with a hemostat until the peritoneum was opened and access was gained to the abdominal cavity.  A 0 Vicryl pursestring stitch was placed in the fascia around the opening. A Hasson cannula was placed through the opening and anchored in place with previously placed Vicryl pursestring stitch.  The abdomen was then insufflated with carbon dioxide without difficulty.  The laparoscope was inserted through the Hasson cannula and the right upper quadrant was inspected.  Next, the epigastric region was infiltrated with 0.25% Marcaine.  A small incision was made with a 15 blade knife.  A 10-mm port was placed bluntly through this  incision into the abdominal cavity under direct vision.  Sites were then chosen laterally on the right side of the abdomen for placement of two 5 mm ports.  Each of these areas were infiltrated with 0.25% Marcaine.  Small stab incisions were made with a 15 blade knife and 5-mm ports were placed bluntly through this incision into the abdominal cavity under direct vision.  A blunt grasper was placed through the lateral most 5-mm port and used to grasp the dome of the gallbladder and elevate it anteriorly and superiorly.  Another blunt grasper was placed through another 5-mm port and used to retract the body and neck of the gallbladder.  There were some omental adhesions to the body of the gallbladder that were taken down with the dissector and electrocautery.  The peritoneal reflection at the gallbladder neck area was then opened sharply with the cautery.  Blunt dissection was carried out in this area until the gallbladder neck and cystic duct junction was readily identified, and a good window was created.  A single clip was placed on the gallbladder neck.  A small ductotomy was made just below the clip with laparoscopic scissors.  A 14-gauge Angiocath was placed percutaneously through the anterior abdominal wall under direct vision.  The Reddick cholangiogram catheter was placed through the Angiocath and flushed.  The [employer]  catheter was then placed within the cystic duct and anchored in place with clip.  Prior to doing this, a single clip was placed on the gallbladder neck.  A small ductotomy was made just below the clip with laparoscopic scissors.  The Reddick catheter was then placed on the cystic duct and anchored in place with the clip.  Cholangiogram was obtained that showed no filling defects, good emptying in duodenum and adequate length on the cystic duct.  Anchoring clip and catheters were then removed from the patient. Three clips were placed proximal on the cystic duct and duct  was divided between two sets of clips.  Posterior to this, the cystic artery was identified and again dissected bluntly in a circumferential manner until a good window was created.  Two clips were placed proximally on the artery and one distally, and the artery was divided between the two. Next, a laparoscopic hook cautery device was used to separate the gallbladder from the liver bed, prior to completely detaching the gallbladder from the liver bed.  The liver bed was inspected and several small bleeding points were coagulated with the electrocautery until the area was completely hemostatic.  The gallbladder was then detached in this way from the liver bed without difficulty.  A laparoscopic bag was inserted through the epigastric port.  The gallbladder was placed in the bag and the bag was sealed.  The abdomen was then irrigated with copious amounts of saline until the effluent was clear.  The gallbladder grasper was then placed through the Hasson cannula.  The laparoscope was removed through the epigastric port.  The gallbladder grasper was used to grasp the opening of the bag and the bag  with the gallbladder was removed with the Hasson cannula through the infraumbilical port without difficulty.  The fascial defect was then closed with previously placed Vicryl pursestring stitch as well as with another figure-of-eight 0 Vicryl stitch.  The rest of the ports were removed under direct vision, were found to be hemostatic.  Gas was allowed to escape.  The skin incisions were closed with interrupted 4-0 Monocryl subcuticular stitches.  Dermabond dressings were applied.  The patient tolerated the procedure well.  At the end of the case, all needle, sponge, and instrument counts were correct.  The patient was then awaken and taken to recovery room in stable condition.     Ollen Gross. Vernell Morgans, M.D.     PST/MEDQ  D:  06/30/2011  T:  06/30/2011  Job:  [id number]  Electronically Signed by  Chevis Pretty III M.D. on 07/14/2011 09:08:05 AM

## 2011-07-17 ENCOUNTER — Ambulatory Visit (INDEPENDENT_AMBULATORY_CARE_PROVIDER_SITE_OTHER): Payer: Self-pay | Admitting: General Surgery

## 2011-07-17 ENCOUNTER — Encounter (INDEPENDENT_AMBULATORY_CARE_PROVIDER_SITE_OTHER): Payer: Self-pay | Admitting: General Surgery

## 2011-07-17 VITALS — BP 116/78 | HR 61 | Temp 97.8°F | Resp 14 | Ht 67.0 in | Wt 193.6 lb

## 2011-07-17 DIAGNOSIS — K8020 Calculus of gallbladder without cholecystitis without obstruction: Secondary | ICD-10-CM

## 2011-07-17 NOTE — Progress Notes (Signed)
Subjective:     Patient ID: [name], female   DOB: 01/18/1981, 30 y.o.   MRN: [id number]  HPI The patient is a 30 year old white female who is now weeks out from a laparoscopic cholecystectomy. She feels somewhat better than she did preop. She is having minimal right upper quadrant discomfort. Her appetite is good and her bowels are working normally. Her stools may be a little bit looser than that were before surgery.  Review of Systems     Objective:   Physical Exam On exam her abdomen is soft and nontender. Her incisions are all healing nicely.    Assessment:     2 weeks status post laparoscopic cholecystectomy    Plan:     The sweating she can begin returning to most of her activities of daily living. I would still try to refrain from any strenuous activity for a couple more weeks. Otherwise we'll see her back on a p.r.n. basis.

## 2011-07-17 NOTE — Patient Instructions (Signed)
May return to all normal activities 

## 2011-07-24 NOTE — Discharge Summary (Signed)
  NAMEGAYE, [name]              ACCOUNT NO.:  [id number]  MEDICAL RECORD NO.:  [id number]  LOCATION:  1537                         FACILITY:  [facility]  PHYSICIAN:  Ollen Gross. Vernell Morgans, M.D. DATE OF BIRTH:  24-Jan-1981  DATE OF ADMISSION:  06/28/2011 DATE OF DISCHARGE:  07/01/2011                              DISCHARGE SUMMARY   ADMISSION DIAGNOSES: 1. Cholecystitis, cholelithiasis. 2. Hypothyroid. 3. Gastroesophageal reflux disease.  DISCHARGE DIAGNOSES: 1. Cholecystitis, cholelithiasis. 2. Hypothyroid. 3. Gastroesophageal reflux disease.  PROCEDURES:  Laparoscopic cholecystectomy with intraoperative cholangiogram, June 30, 2011.  BRIEF HISTORY:  The patient is a 30 year old white female who has had recurrent abdominal pain after meals.  It has been worse over the last week, yesterday, and this morning.  When she came in on June 28, 2011, it was the most severe, but she has had this problem for some time starting in May of this year.  She was seen in the ER at [facility] where an abdominal ultrasound showed multiple stones of 2 cm. There was no gallbladder wall thickening.  She had a previous ultrasound done in May 2012 where she had a gallbladder with stones present. Common bile duct at that time was 3 mm.  On admission at this point, it was up to 5.5 mm.  Her white count on admission in the ER was 18,000, hemoglobin 15, hematocrit 44.  LFTs were normal.  Lipase was 30.  HOSPITAL COURSE:  The patient was admitted and placed on IV Cipro.  She was transferred to the floor and seen in the morning by Dr. Carolynne Edouard and then taken to the OR.  She had laparoscopic cholecystectomy with IOC.  Postoperatively, she is doing well.  She is on clear liquids.  She is doing well.  We will advance her pain medicines and her diet and if she does well, we will plan to discharge her home in the a.m. July 01, 2011.  DISCHARGE MEDICATIONS: 1. Hydrocodone/APAP 5/325 one to two q.4 h.  P.r.n. 2. She can resume her preadmission Pepcid 20 mg daily. 3. Ibuprofen 200 mg 2 tablets q.4 p.r.n. 4. Synthroid 100 mcg daily.  FINAL DIAGNOSES: 1. Cholecystitis, cholelithiasis. 2. Hypothyroid. 3. Possible gastroesophageal reflux disease.  PLAN:  She will follow up with Dr. Carolynne Edouard in 2 weeks.  CONDITION ON DISCHARGE:  Improved.     [name], P.A.   ______________________________ Ollen Gross. Vernell Morgans, M.D.    WDJ/MEDQ  D:  06/30/2011  T:  07/01/2011  Job:  [id number]  cc:   Dr. Denice Paradise  Electronically Signed by Sherrie George P.A. on 07/14/2011 04:08:54 PM Electronically Signed by Chevis Pretty III M.D. on 07/24/2011 08:10:16 AM

## 2011-07-25 ENCOUNTER — Telehealth (INDEPENDENT_AMBULATORY_CARE_PROVIDER_SITE_OTHER): Payer: Self-pay

## 2011-07-25 NOTE — Telephone Encounter (Signed)
[facility] Norco 5/325.  Dr Billey Chang last note said he released her to f/u prn.  I discussed this with Kendell Bane and she will page him in the am.

## 2011-07-26 ENCOUNTER — Telehealth (INDEPENDENT_AMBULATORY_CARE_PROVIDER_SITE_OTHER): Payer: Self-pay | Admitting: General Surgery

## 2011-07-26 NOTE — Telephone Encounter (Signed)
FAXED REFILL REQUEST FOR HYDROCODONE 5/325 #30 RECEIVED FROM [facility] [address]. DR. Demetrius Charity. TOTH ADVISED. RESPONSE PER T.O.FROM DR. TOTH WAS NO REFILL. CALLED TO [facility]

## 2011-07-27 NOTE — H&P (Signed)
[name], ESKER              ACCOUNT NO.:  [id number]  MEDICAL RECORD NO.:  [id number]  LOCATION:  WLED                         FACILITY:  [facility]  PHYSICIAN:  Anselm Pancoast. [name], M.D.DATE OF BIRTH:  [birth year]  DATE OF ADMISSION:  06/28/2011 DATE OF DISCHARGE:                             HISTORY & PHYSICAL   REFERRING PHYSICIAN:  [name], MD  PRIMARY CARE DOCTOR: 1. [name], M.D., [facility]. 2. [name], MD, [facility].  BRIEF HISTORY:  The patient is a 30 year old white female who has a history of recurrent abdominal pain, it usually comes after meals.  She says it has been going on for years.  She was first seen in May 2012 and underwent abdominal ultrasound, which showed cholelithiasis but no cholecystitis.  Her common bile duct was 3 mm at that time.  She had multiple stones.  There was a concern with an ulcer and she was seen by Dr. [name], and apparently underwent an EGD in June of this year, which was normal.  Dr. [name] gave her some Vicodin.  She has never seen a Careers adviser for this.  She has had recurrent episodes.  She had one on June 24, 2011 after a large family dinner.  She said it lasted several hours and then yesterday around 8 p.m. she started having pain again, it became so severe, she came to the emergency room, had nausea and vomiting in the ER waiting room.  This episode lasted until she was treated with pain medicine in the ER.  A repeat gallbladder ultrasound shows multiple stones up to 2 cm.  There is no gallbladder wall thickening.  No pericholecystic fluid.  The common bile duct is 5.5 mm at this time.  Labs showed a white count of 18,000, hemoglobin of 15, hematocrit of 44, platelets of 449,000.  Sodium is 137, potassium 3.8, chloride is 104, BUN is 9, creatinine is 0.9, glucose 98.  LFTs are normal.  Lipase was 30, total bilirubin was 0.5, alk phos 70, SGOT slightly elevated at 70, and SGPT is 21.  We are  asked to see the patient by Dr. Jerral Ralph in the ER.  PAST MEDICAL HISTORY: 1. She has 2 children. 2. Hypothyroid. 3. GERD.  PAST SURGICAL HISTORY:  She has had 2 C sections.  Her children are ages 85 and 83.  FAMILY HISTORY:  Father is unknown.  Mother has a history of cholecystectomy and hypothyroid.  She has several brothers, all of whom are in good health.  No sisters.  SOCIAL HISTORY:  Tobacco, about a half-pack a day 10+ years.  Alcohol, none.  Drugs, none.  She is married.  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  REVIEW OF SYSTEMS:  FEVER:  None.  SKIN:  No changes.  CEREBROVASCULAR: No history of stroke, seizure, syncope.  PSYCH:  No changes.  CARDIAC: No chest pain or palpitations.  PULMONARY:  No orthopnea, PND, or DOE. She does have a chronic dry cough.  GI:  Positive for GERD.  Positive for nausea and vomiting last night.  No diarrhea or constipation.  No blood in her stool.  GU:  Negative.  GYN:  Her periods are regular. LOWER EXTREMITIES:  No edema or claudication.  MUSCULOSKELETAL:  No joint changes.  CURRENT MEDICATIONS: 1. Synthroid 100 mcg daily. 2. Vicodin p.r.n. for pain, given by Dr. [name]. 3. Pepcid 20 mg p.o. b.i.d.  ALLERGIES:  She got a rash with Minocycline as a teenager.  PHYSICAL EXAMINATION:  GENERAL:  This is a well-nourished, well- developed white female in no acute distress. VITAL SIGNS:  Temperature is 98.7, heart rate is 86, blood pressure is 124/77, respiratory rate is 20. HEAD:  Normocephalic.  Eyes, ears, nose, and throat are grossly within normal limits. NECK:  Trachea is midline.  There is no bruits.  No JVD.  No thyromegaly. CHEST:  Clear to auscultation and percussion.  Nontender to palpation. CARDIAC:  Normal S1 and S2.  No murmurs or rubs.  Pulses are +2, equal in the upper and lower extremities. ABDOMEN:  Soft, nontender.  Positive bowel sounds.  No palpable hepatosplenomegaly.   There are no hernias, masses, or abscesses.  She is slightly overweight.  She has a C-section scar. GU/RECTAL:  Deferred. LYMPHADENOPATHY:  None palpated in cervical, axillary, or femoral. NEUROLOGIC:  No focal deficits.  Cranial nerves grossly intact. PSYCH:  Normal affect.  LABORATORY DATA:  Labs as above.  IMPRESSION: 1. Cholecystitis and cholelithiasis. 2. Hypothyroid. 3. Gastroesophageal reflux disease.  PLAN:  I am going to keep her n.p.o. for now.  We started her on IV Cipro, maintain IV hydration.  If we cannot do it today, then we will schedule for cholecystectomy tomorrow.     [name], P.A.   ______________________________ Anselm Pancoast. [name], M.D.    WDJ/MEDQ  D:  06/29/2011  T:  06/29/2011  Job:  [id number]  cc:   [name], MD Fax: [phone]  [name], M.D. Fax: [phone]  Electronically Signed by Sherrie George P.A. on 07/14/2011 04:11:09 PM Electronically Signed by Consuello Bossier M.D. on 07/27/2011 10:47:16 AM

## 2011-08-01 ENCOUNTER — Telehealth (INDEPENDENT_AMBULATORY_CARE_PROVIDER_SITE_OTHER): Payer: Self-pay

## 2011-08-01 NOTE — Telephone Encounter (Signed)
[facility] Norco 5-325mg  #30/ [facility]

## 2011-09-20 ENCOUNTER — Ambulatory Visit: Payer: Self-pay | Admitting: Family Medicine

## 2011-09-28 ENCOUNTER — Other Ambulatory Visit: Payer: Self-pay | Admitting: Family Medicine

## 2011-09-28 NOTE — Telephone Encounter (Signed)
Received refill request electronically from pharmacy. Patient has not seen you in several years.

## 2012-01-22 ENCOUNTER — Ambulatory Visit: Payer: Self-pay | Admitting: Family Medicine

## 2012-12-03 IMAGING — US US ABDOMEN COMPLETE
1 series · 14 of 25 positions shown · non-contrast
Comparison: None.

CLINICAL DATA: Abdominal pain.

COMPLETE ABDOMINAL ULTRASOUND

[Series 1: us abdomen complete · 0.32mm/px · 14 of 82 slices shown]
[im 1/82]
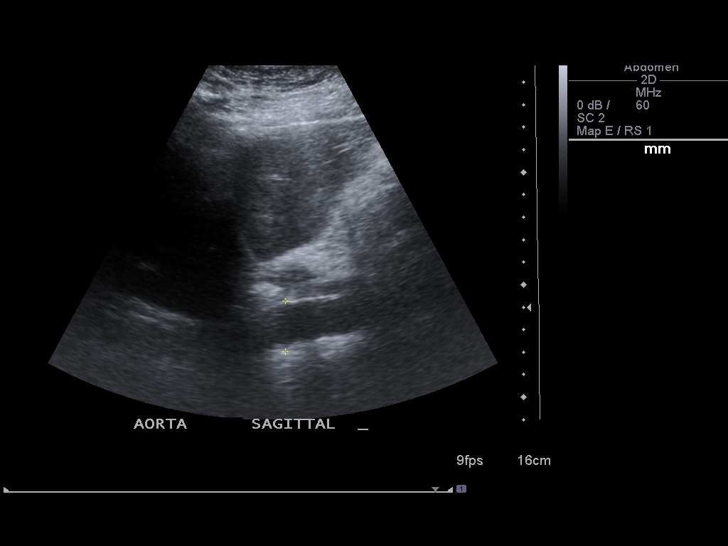
[im 7/82]
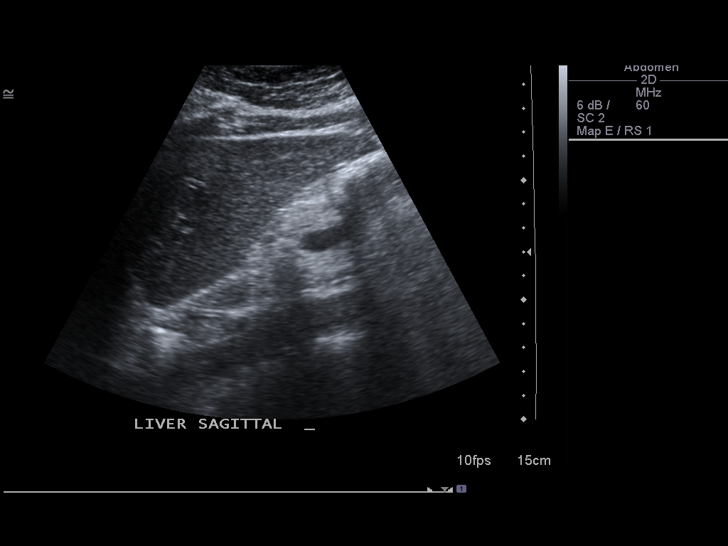
[im 14/82]
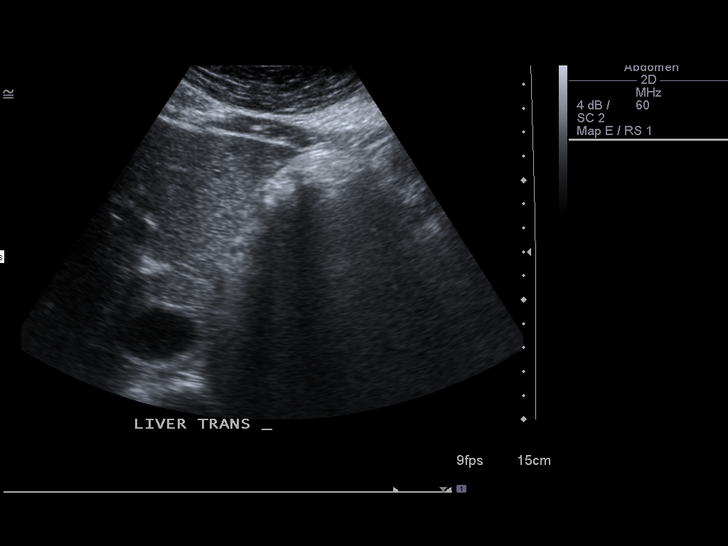
[im 21/82]
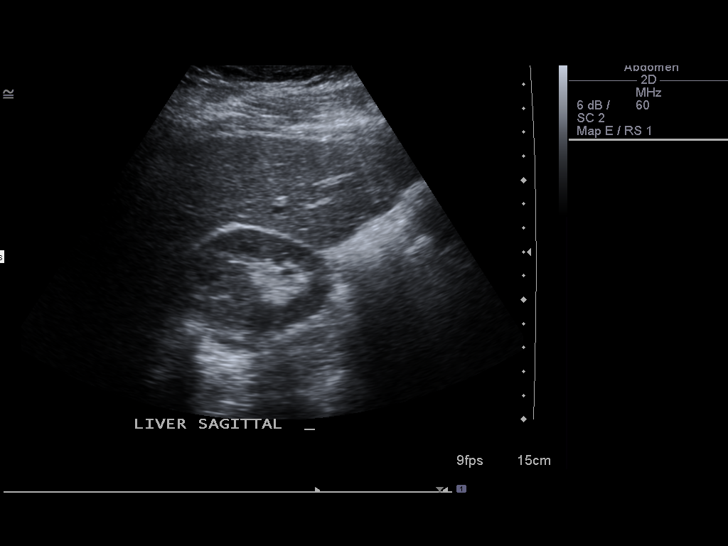
[im 28/82]
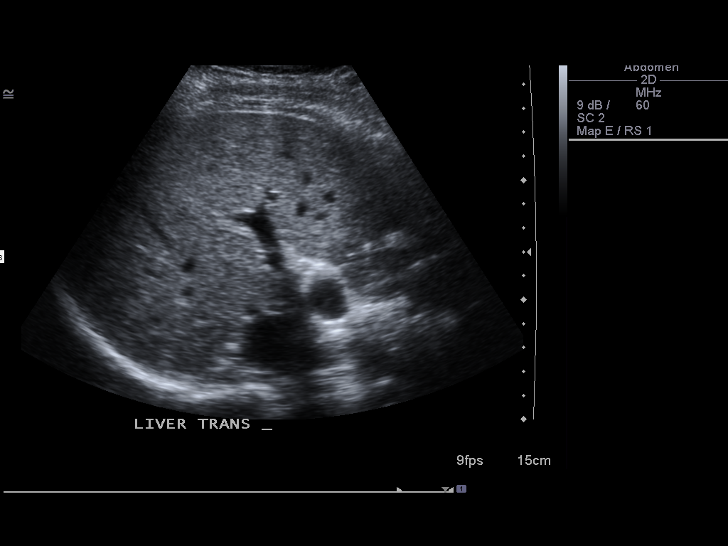
[im 31/82]
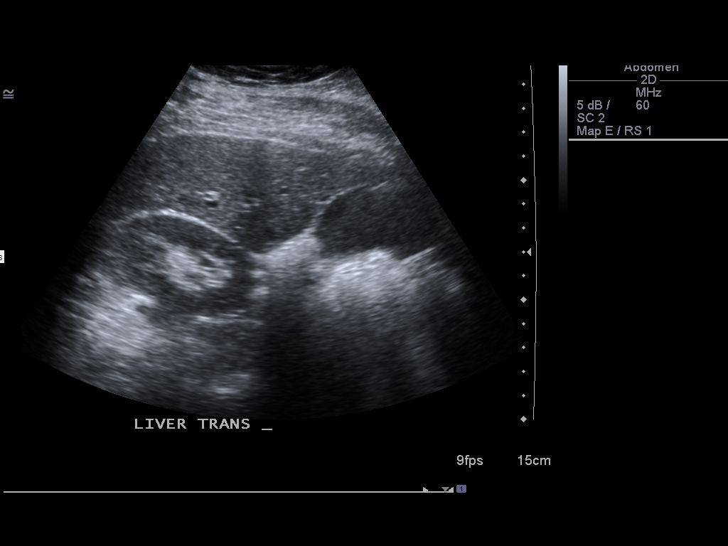
[im 38/82]
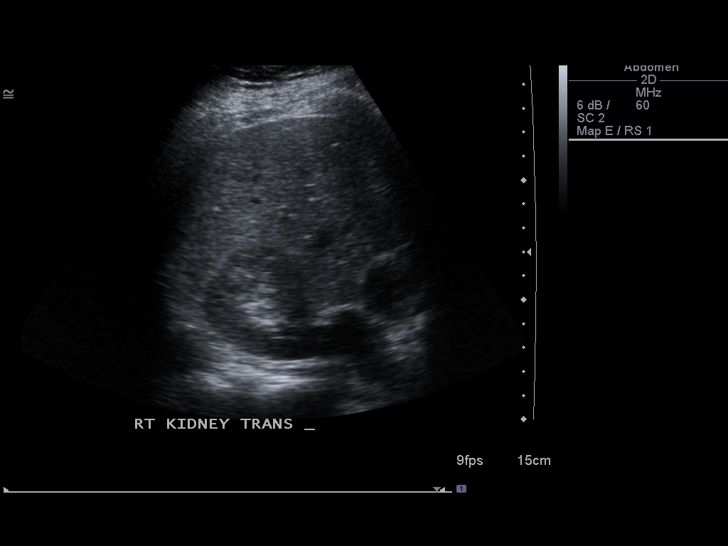
[im 44/82]
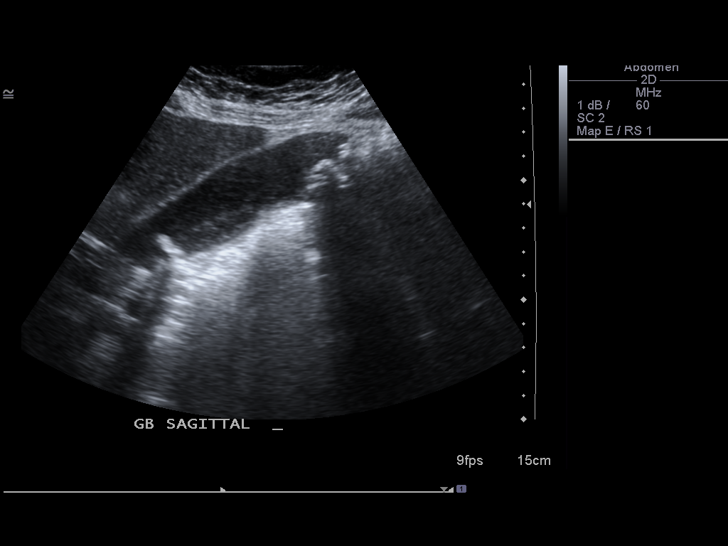
[im 51/82]
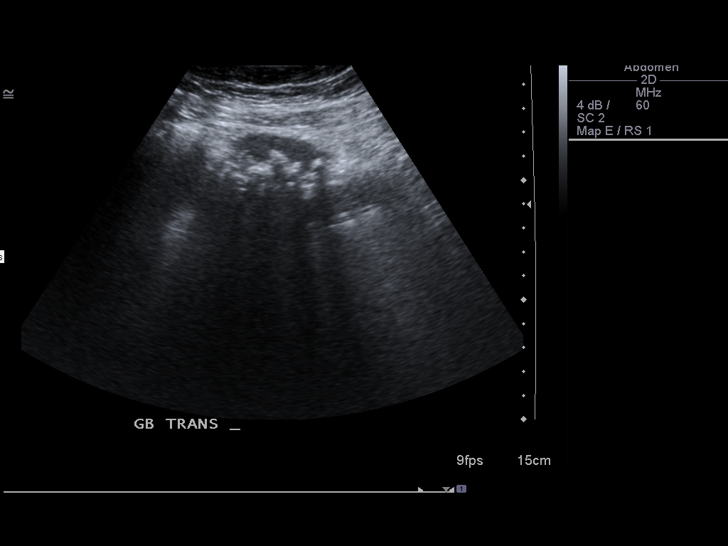
[im 55/82]
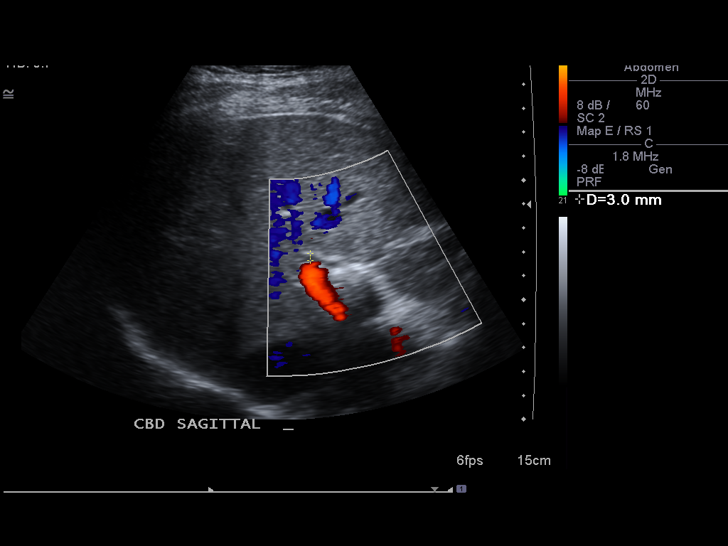
[im 61/82]
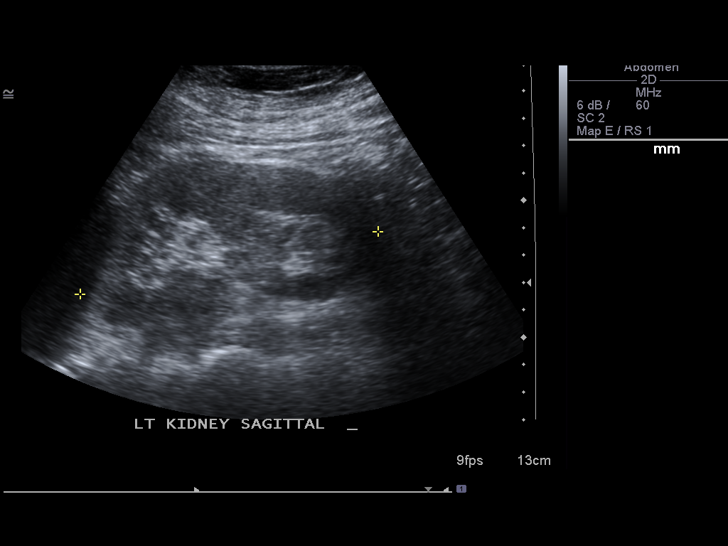
[im 68/82]
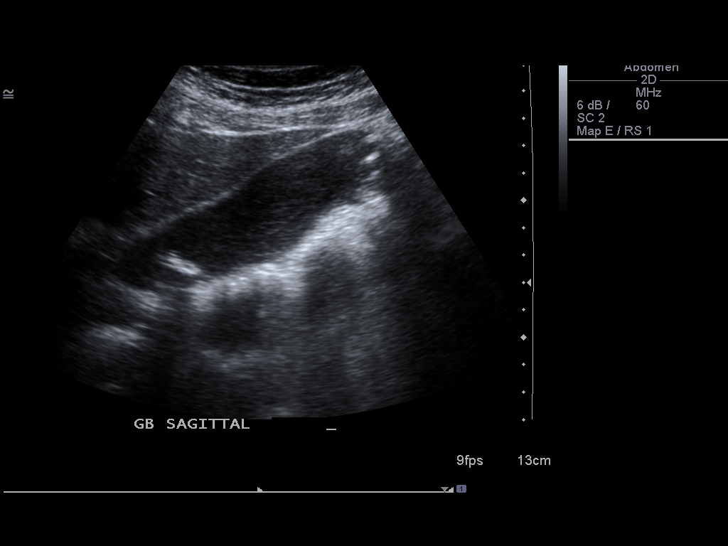
[im 75/82]
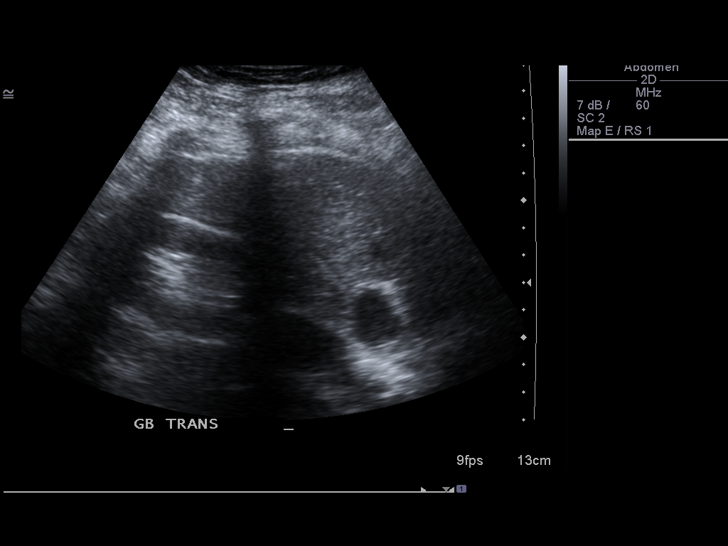
[im 82/82]
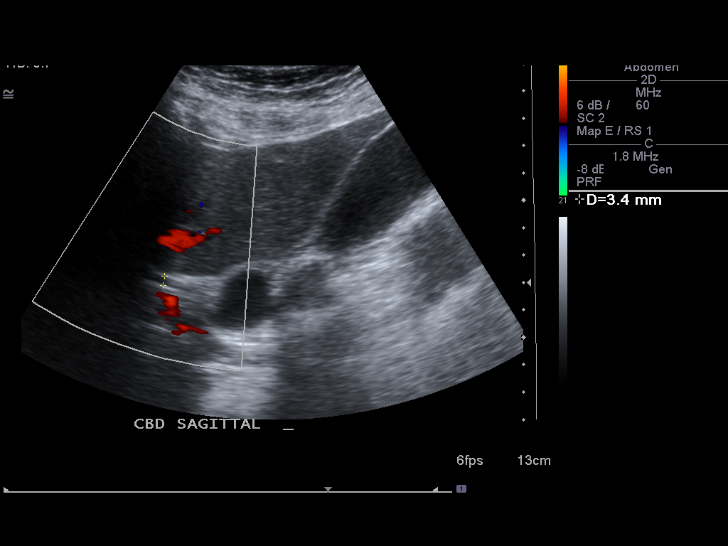

[14 of 25 positions shown; findings below may reference images not displayed]

FINDINGS: Gallbladder:  There are multiple shadowing gallstones present
within the gallbladder.  The gallbladder wall thickness is normal.
There is no pericholecystic fluid.  There is no tenderness to
transducer palpation of the gallbladder.

Common bile duct:  Common bile duct is normal in caliber measuring
3 mm in diameter.

Liver:  No focal lesion identified.  Within normal limits in
parenchymal echogenicity.

IVC:  Appears normal.

Pancreas:  There are no focal abnormalities.  The tail of the
pancreas is not fully visualized.

Spleen:  Normal appearance.The spleen measures 6.9 cm in length.

Right Kidney:  The right kidney is normal in appearance measuring
11.4 cm in length.

Left Kidney:  The left kidney has a normal appearance measuring
11.1 cm in length.

Abdominal aorta:  No aneurysm identified.
IMPRESSION: Multiple shadowing gallstones within the gallbladder with a normal
gallbladder wall thickness.  Otherwise, negative study.

## 2013-01-24 ENCOUNTER — Ambulatory Visit (INDEPENDENT_AMBULATORY_CARE_PROVIDER_SITE_OTHER): Payer: BC Managed Care – PPO | Admitting: Family Medicine

## 2013-01-24 ENCOUNTER — Encounter: Payer: Self-pay | Admitting: *Deleted

## 2013-01-24 ENCOUNTER — Encounter: Payer: Self-pay | Admitting: Family Medicine

## 2013-01-24 VITALS — BP 120/80 | HR 96 | Temp 98.3°F | Ht 67.0 in | Wt 171.5 lb

## 2013-01-24 DIAGNOSIS — J029 Acute pharyngitis, unspecified: Secondary | ICD-10-CM

## 2013-01-24 LAB — POCT RAPID STREP A (OFFICE): Rapid Strep A Screen: POSITIVE — AB

## 2013-01-24 MED ORDER — MAGIC MOUTHWASH W/LIDOCAINE: 2.0000 mL | Freq: Four times a day (QID) | ORAL | Status: AC | PRN

## 2013-01-24 MED ORDER — AMOXICILLIN 500 MG PO CAPS: 1000.0000 mg | ORAL_CAPSULE | Freq: Two times a day (BID) | ORAL | Status: AC

## 2013-01-24 NOTE — Progress Notes (Signed)
  Subjective:    Patient ID: [name], female    DOB: Jan 30, 1981, 32 y.o.   MRN: [id number]  Sore Throat  This is a new problem. The current episode started in the past 7 days (3 days). Neither side of throat is experiencing more pain than the other. Maximum temperature: subjective. The fever has been present for 1 to 2 days. The pain is severe. Associated symptoms include congestion, ear pain, headaches, trouble swallowing and vomiting. Pertinent negatives include no abdominal pain, coughing, drooling, ear discharge, hoarse voice or plugged ear sensation. Associated symptoms comments: sores on chin as well.. She has had no exposure to strep or mono. Treatments tried: alkaseltzer, ricola, cold and siuns medicaiton. The treatment provided mild relief.      Review of Systems  HENT: Positive for ear pain, congestion and trouble swallowing. Negative for hoarse voice, drooling and ear discharge.   Respiratory: Negative for cough.   Gastrointestinal: Positive for vomiting. Negative for abdominal pain.  Neurological: Positive for headaches.       Objective:   Physical Exam  Constitutional: Vital signs are normal. She appears well-developed and well-nourished. She is cooperative.  Non-toxic appearance. She does not appear ill. No distress.  HENT:  Head: Normocephalic.  Right Ear: Hearing, tympanic membrane, external ear and ear canal normal. Tympanic membrane is not erythematous, not retracted and not bulging.  Left Ear: Hearing, tympanic membrane, external ear and ear canal normal. Tympanic membrane is not erythematous, not retracted and not bulging.  Nose: Mucosal edema and rhinorrhea present. Right sinus exhibits no maxillary sinus tenderness and no frontal sinus tenderness. Left sinus exhibits no maxillary sinus tenderness and no frontal sinus tenderness.  Mouth/Throat: Uvula is midline and mucous membranes are normal. Oropharyngeal exudate, posterior oropharyngeal edema and posterior  oropharyngeal erythema present. No tonsillar abscesses.  Eyes: Conjunctivae, EOM and lids are normal. Pupils are equal, round, and reactive to light. No foreign bodies found.  Neck: Trachea normal and normal range of motion. Neck supple. Carotid bruit is not present. No mass and no thyromegaly present.  Cardiovascular: Normal rate, regular rhythm, S1 normal, S2 normal, normal heart sounds, intact distal pulses and normal pulses.  Exam reveals no gallop and no friction rub.   No murmur heard. Pulmonary/Chest: Effort normal and breath sounds normal. Not tachypneic. No respiratory distress. She has no decreased breath sounds. She has no wheezes. She has no rhonchi. She has no rales.  Neurological: She is alert.  Skin: Skin is warm, dry and intact. No rash noted.  Psychiatric: Her speech is normal and behavior is normal. Judgment normal. Her mood appears not anxious. Cognition and memory are normal. She does not exhibit a depressed mood.          Assessment & Plan:

## 2013-01-24 NOTE — Patient Instructions (Addendum)
Ibuprofen 800 mg every eight hours for sore throat pain. Can use numbing gargle for throat pain. Complete the antibiotics.  Remain out of work and public space until 24 hours after the antibiotics get in system.

## 2013-05-02 IMAGING — US US ABDOMEN COMPLETE
1 series · 14 of 25 positions shown · non-contrast
Comparison: Ultrasound abdomen 01/30/2011.

CLINICAL DATA: Abdominal pain.

COMPLETE ABDOMINAL ULTRASOUND

[Series 1: us abdomen complete · 0.30mm/px · 14 of 149 slices shown]
[im 1/149]
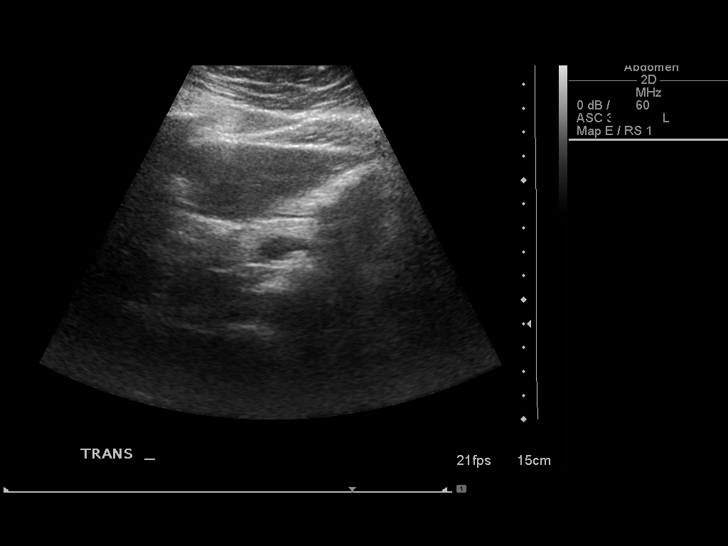
[im 13/149]
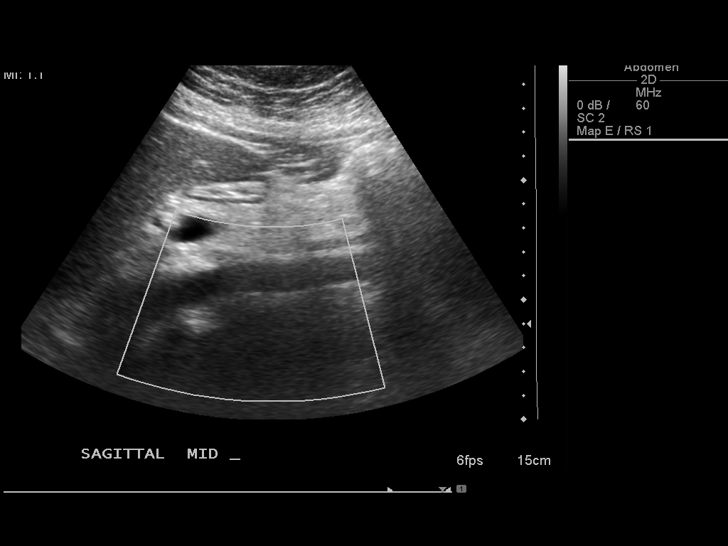
[im 25/149]
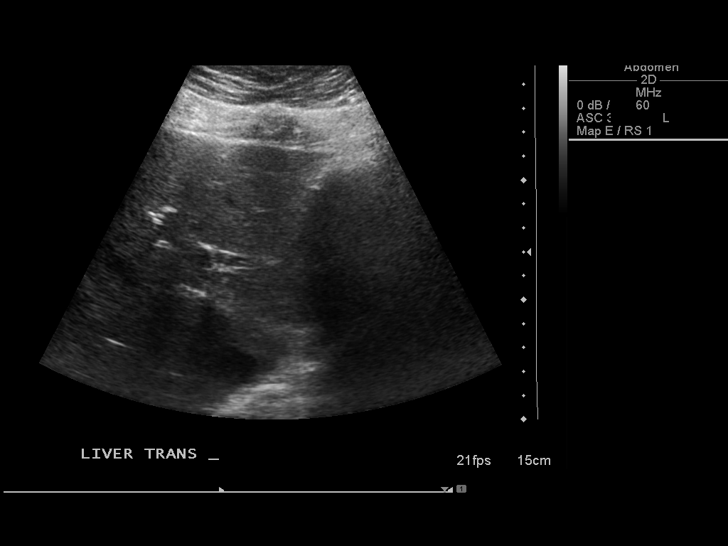
[im 38/149]
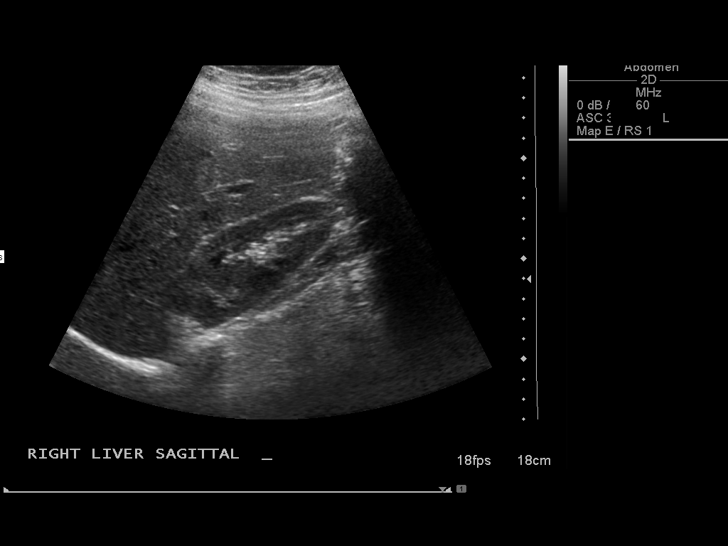
[im 50/149]
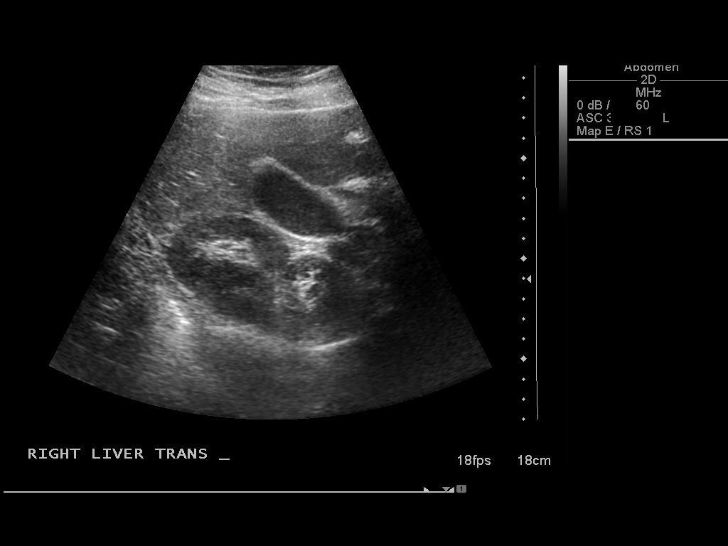
[im 56/149]
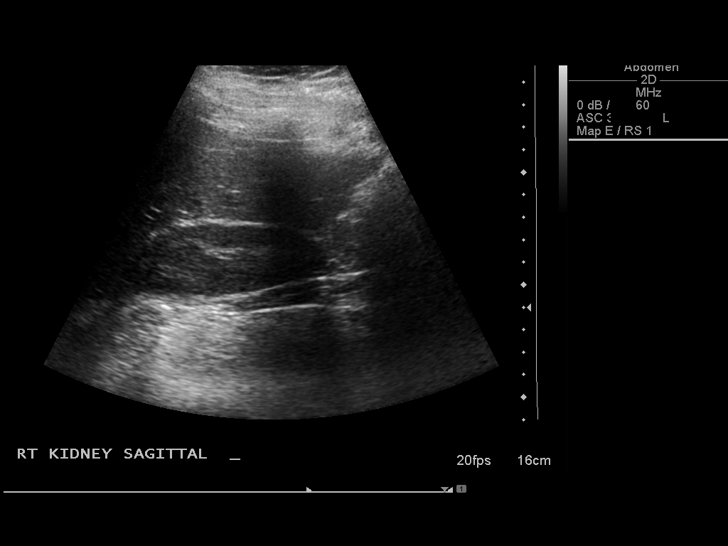
[im 68/149]
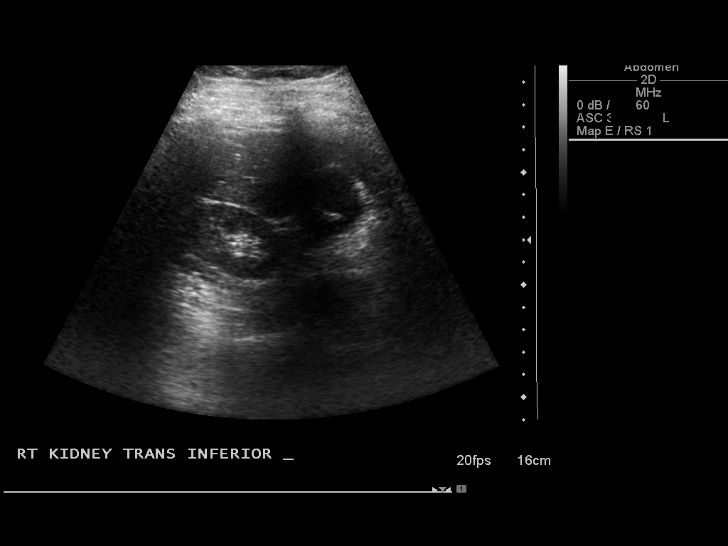
[im 81/149]
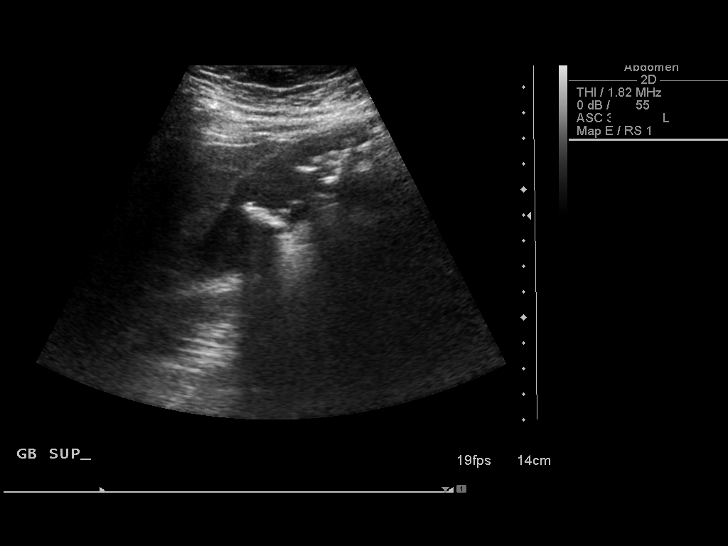
[im 93/149]
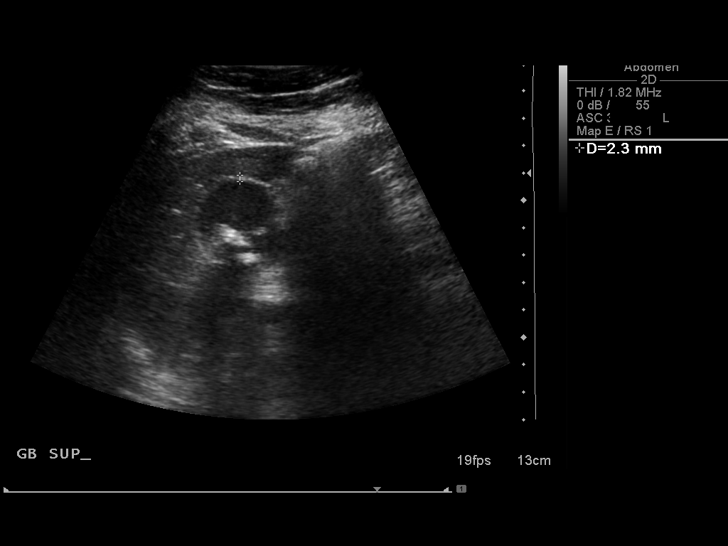
[im 99/149]
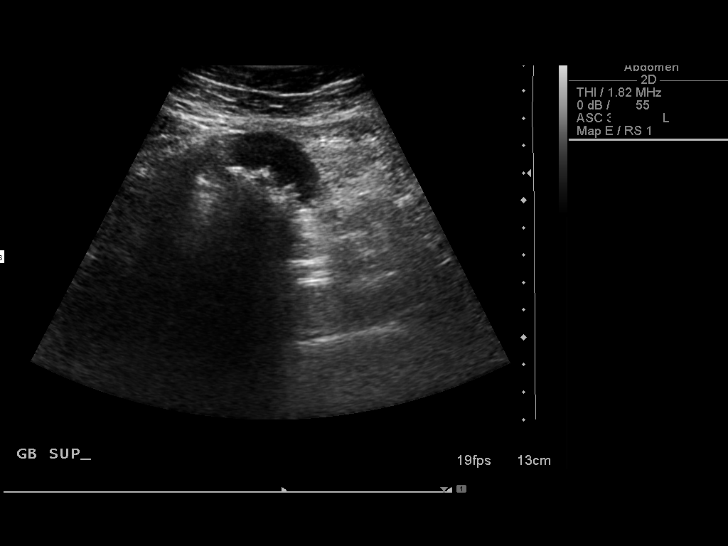
[im 112/149]
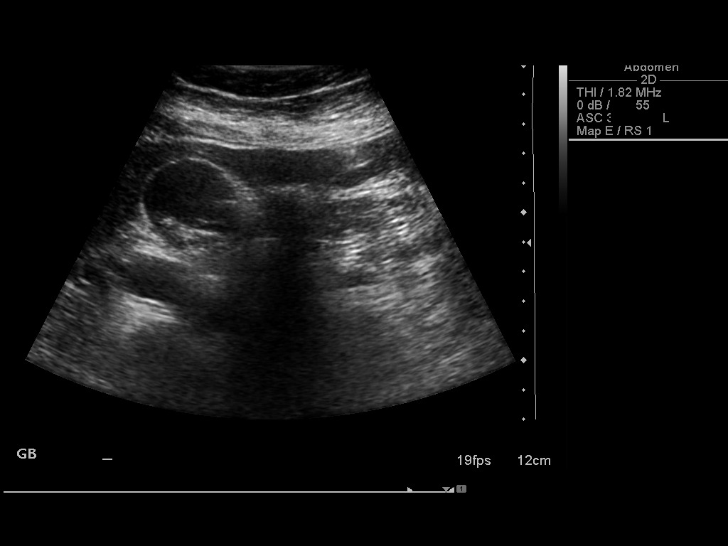
[im 124/149]
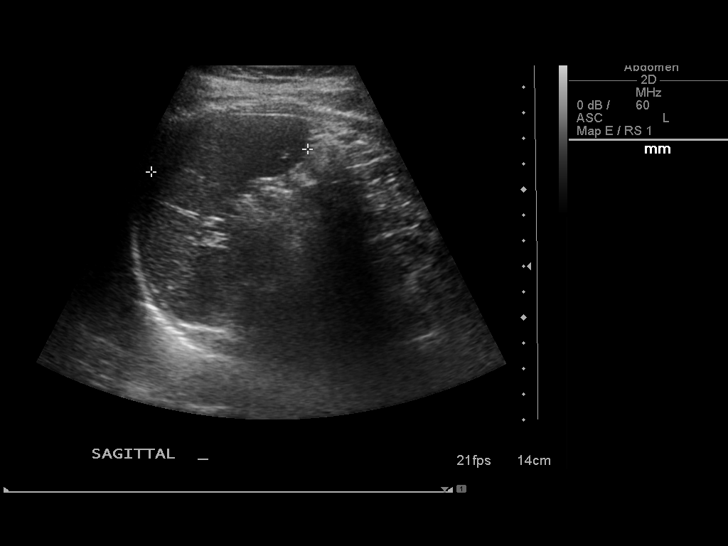
[im 136/149]
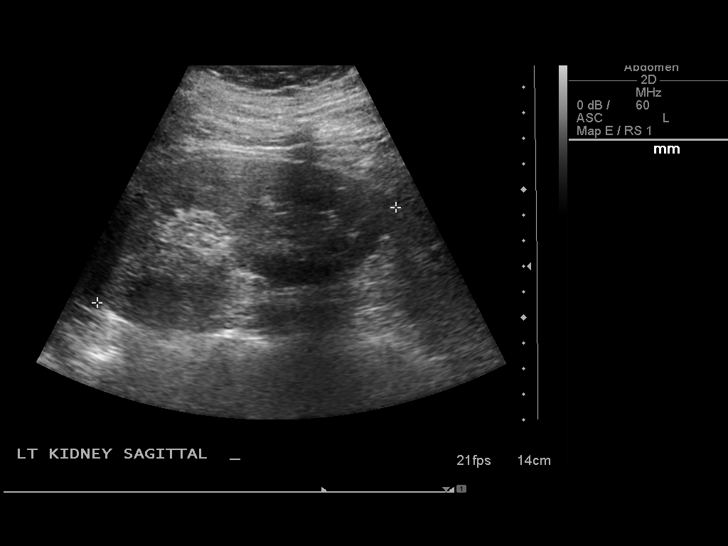
[im 149/149]
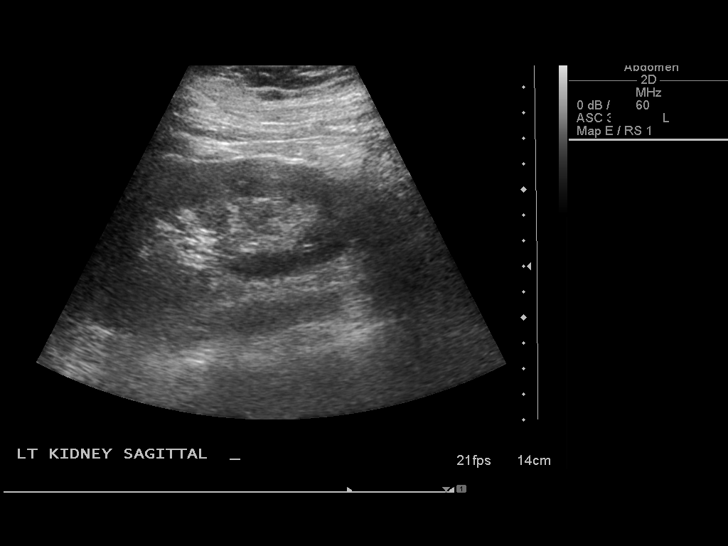

[14 of 25 positions shown; findings below may reference images not displayed]

FINDINGS: Gallbladder:  There are numerous echogenic gallstones with
associated acoustic shadowing.  The largest gallstone measures 2
cm.  No gallbladder wall thickening, pericholecystic fluid or
sonographic Murphy's sign.

Common bile duct:  Normal in caliber measuring a maximum of 5.5mm.

Liver:  The liver is sonographically unremarkable.  There is normal
echogenicity without focal lesions or intrahepatic biliary
dilatation.

IVC:  Normal caliber.

Pancreas:  Sonographically unremarkable.

Spleen:  Normal size and echogenicity without focal lesions.

Right Kidney:  11.9 cm in length. Normal renal cortical thickness
and echogenicity without focal lesions or hydronephrosis.

Left Kidney:  12.3 cm in length. Normal renal cortical thickness
and echogenicity without focal lesions or hydronephrosis.

Abdominal aorta:  Normal caliber.
IMPRESSION: 1.  Cholelithiasis without sonographic findings for acute
cholecystitis.
2.  Normal caliber common bile duct.
3.  Normal sonographic appearance of the liver, spleen, pancreas
and both kidneys.

## 2015-03-11 ENCOUNTER — Emergency Department (HOSPITAL_COMMUNITY)
Admission: EM | Admit: 2015-03-11 | Discharge: 2015-03-11 | Disposition: A | Discharge status: Home / Self Care | Payer: Self-pay | Attending: Emergency Medicine | Admitting: Emergency Medicine

## 2015-03-11 ENCOUNTER — Encounter (HOSPITAL_COMMUNITY): Payer: Self-pay | Admitting: Emergency Medicine

## 2015-03-11 DIAGNOSIS — Z3202 Encounter for pregnancy test, result negative: Secondary | ICD-10-CM | POA: Insufficient documentation

## 2015-03-11 DIAGNOSIS — R197 Diarrhea, unspecified: Secondary | ICD-10-CM | POA: Insufficient documentation

## 2015-03-11 DIAGNOSIS — Z8719 Personal history of other diseases of the digestive system: Secondary | ICD-10-CM | POA: Insufficient documentation

## 2015-03-11 DIAGNOSIS — R110 Nausea: Secondary | ICD-10-CM | POA: Insufficient documentation

## 2015-03-11 DIAGNOSIS — Z87891 Personal history of nicotine dependence: Secondary | ICD-10-CM | POA: Insufficient documentation

## 2015-03-11 DIAGNOSIS — Z79899 Other long term (current) drug therapy: Secondary | ICD-10-CM | POA: Insufficient documentation

## 2015-03-11 LAB — CBC WITH DIFFERENTIAL/PLATELET
Basophils Absolute: 0 10*3/uL (ref 0.0–0.1)
Basophils Relative: 0 % (ref 0–1)
Eosinophils Absolute: 0.1 10*3/uL (ref 0.0–0.7)
Eosinophils Relative: 0 % (ref 0–5)
HCT: 44.1 % (ref 36.0–46.0)
Hemoglobin: 14.5 g/dL (ref 12.0–15.0)
Lymphocytes Relative: 19 % (ref 12–46)
Lymphs Abs: 3.4 10*3/uL (ref 0.7–4.0)
MCH: 30 pg (ref 26.0–34.0)
MCHC: 32.9 g/dL (ref 30.0–36.0)
MCV: 91.3 fL (ref 78.0–100.0)
Monocytes Absolute: 1.4 10*3/uL — ABNORMAL HIGH (ref 0.1–1.0)
Monocytes Relative: 8 % (ref 3–12)
Neutro Abs: 12.8 10*3/uL — ABNORMAL HIGH (ref 1.7–7.7)
Neutrophils Relative %: 73 % (ref 43–77)
Platelets: 505 10*3/uL — ABNORMAL HIGH (ref 150–400)
RBC: 4.83 MIL/uL (ref 3.87–5.11)
RDW: 14.1 % (ref 11.5–15.5)
WBC: 17.7 10*3/uL — ABNORMAL HIGH (ref 4.0–10.5)

## 2015-03-11 LAB — COMPREHENSIVE METABOLIC PANEL
ALT: 28 U/L (ref 14–54)
AST: 30 U/L (ref 15–41)
Albumin: 4.6 g/dL (ref 3.5–5.0)
Alkaline Phosphatase: 74 U/L (ref 38–126)
Anion gap: 10 (ref 5–15)
BILIRUBIN TOTAL: 1.2 mg/dL (ref 0.3–1.2)
BUN: 7 mg/dL (ref 6–20)
CALCIUM: 9.3 mg/dL (ref 8.9–10.3)
CO2: 24 mmol/L (ref 22–32)
Chloride: 102 mmol/L (ref 101–111)
Creatinine, Ser: 0.81 mg/dL (ref 0.44–1.00)
GFR calc Af Amer: >60 mL/min (ref >60)
Glucose, Bld: 101 mg/dL — ABNORMAL HIGH (ref 65–99)
Potassium: 3.3 mmol/L — ABNORMAL LOW (ref 3.5–5.1)
SODIUM: 136 mmol/L (ref 135–145)
TOTAL PROTEIN: 8.6 g/dL — AB (ref 6.5–8.1)

## 2015-03-11 LAB — LIPASE, BLOOD: Lipase: 21 U/L — ABNORMAL LOW (ref 22–51)

## 2015-03-11 LAB — POC URINE PREG, ED: Preg Test, Ur: NEGATIVE

## 2015-03-11 MED ORDER — LOPERAMIDE HCL 2 MG PO CAPS: 2.0000 mg | ORAL_CAPSULE | Freq: Four times a day (QID) | ORAL | Status: AC | PRN

## 2015-03-11 MED ORDER — ALPRAZOLAM 0.5 MG PO TABS
1.0000 mg | ORAL_TABLET | Freq: Once | ORAL | Status: AC
  Administered 2015-03-11: 1 mg via ORAL
  Filled 2015-03-11: qty 2

## 2015-03-11 MED ORDER — ONDANSETRON HCL 4 MG/2ML IJ SOLN
4.0000 mg | Freq: Once | INTRAMUSCULAR | Status: AC
  Administered 2015-03-11: 4 mg via INTRAVENOUS
  Filled 2015-03-11: qty 2

## 2015-03-11 MED ORDER — ONDANSETRON HCL 4 MG PO TABS: 4.0000 mg | ORAL_TABLET | Freq: Four times a day (QID) | ORAL | Status: AC

## 2015-03-11 MED ORDER — SODIUM CHLORIDE 0.9 % IV BOLUS (SEPSIS)
1000.0000 mL | Freq: Once | INTRAVENOUS | Status: AC
  Administered 2015-03-11: 1000 mL via INTRAVENOUS

## 2015-03-11 NOTE — ED Notes (Signed)
Pt c/o nausea and diarrhea since Sunday.  States that she feels weak and her skin feels sensitive.  Low abd pain that she contributes to being on her period.  "It's been a bad week.  I shit myself twice".

## 2015-03-11 NOTE — ED Notes (Signed)
PA at bedside.

## 2015-03-11 NOTE — Discharge Instructions (Signed)
Please use medications only as directed, please continue eating and drinking as tolerated, bananas rice apples and toast may help with stomach upset. Please contact her primary care provider inform them of your visit today and all relevant data. Schedule follow-up visit in 3 days for reevaluation of symptoms. Please return to the emergency room immediately if new or worsening signs or symptoms present. Please read attached information

## 2015-03-11 NOTE — ED Notes (Addendum)
Patient reports having extreme anxiety.  PA-C made aware.  Patient reports she takes xanax for anxiety at home.  Patient made aware urine specimen needed.  Patient states "we have to take care of my anxiety first".

## 2015-03-11 NOTE — ED Provider Notes (Signed)
CSN: [id number]     Arrival date & time 03/11/15  1603 History   First MD Initiated Contact with Patient 03/11/15 1810     Chief Complaint  Patient presents with  . Nausea  . Diarrhea   HPI   34 year old female presents today with nausea and diarrhea. Patient reports symptoms developed 5 days ago. Patient reports that approximately every 2 hours she has a watery bowel movement, worse after eating. She denies any exposure to abnormal food or drink, no recent travel history, no one in the house experiencing similar symptoms. She describes the diarrhea as watery, no blood, mucus, pus. No recent antibiotic exposure. 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 Patient denies fever, chills, vomiting, focal abdominal pain, rashes, lotion swelling or edema, changes in urine color clarity or characteristics. She does report that she was bitten by a tick on 02/06/2015 approximally 1 month ago.  She reports she's been mildly drink, but reports she does not have an appetite, reports nausea and aversion to food, no vomiting.   Past Medical History  Diagnosis Date  . Abdominal pain, other specified site   . Calculus of gallbladder without mention of cholecystitis or obstruction    Past Surgical History  Procedure Laterality Date  . Cesarean section      x two  . Cholecystectomy  06/2011   Family History  Problem Relation Age of Onset  . Cancer Neg Hx    History  Substance Use Topics  . Smoking status: Former Smoker    Quit date: 07/16/2006  . Smokeless tobacco: Never Used  . Alcohol Use: No   OB History    No data available     Review of Systems  All other systems reviewed and are negative.   Allergies  Medroxyprogesterone acetate; Minocycline hcl; and Sulfamethoxazole-trimethoprim  Home Medications   Prior to Admission medications   Medication Sig Start Date End Date Taking? Authorizing Provider  ALPRAZolam Prudy Feeler) 1 MG tablet Take 1 mg by mouth 3 (three) times daily.   Yes Historical  Provider, MD  amphetamine-dextroamphetamine (ADDERALL) 20 MG tablet Take 20 mg by mouth 2 (two) times daily.   Yes Historical Provider, MD  FLUoxetine (PROZAC) 20 MG capsule Take 20 mg by mouth daily.   Yes Historical Provider, MD  lamoTRIgine (LAMICTAL) 25 MG tablet Take 25 mg by mouth 2 (two) times daily.   Yes Historical Provider, MD  Alum & Mag Hydroxide-Simeth (MAGIC MOUTHWASH W/LIDOCAINE) SOLN Take 2-5 mLs by mouth 4 (four) times daily as needed. Gargel and swallow a small amount as needed for sore throat. Patient not taking: Reported on 03/11/2015 01/24/13   Excell Seltzer, MD  amoxicillin (AMOXIL) 500 MG capsule Take 2 capsules (1,000 mg total) by mouth 2 (two) times daily. Patient not taking: Reported on 03/11/2015 01/24/13   Excell Seltzer, MD  levothyroxine (SYNTHROID, LEVOTHROID) 50 MCG tablet Take 1 tablet (50 mcg total) by mouth daily. Patient not taking: Reported on 03/11/2015 06/08/11   Judy Pimple, MD  loperamide (IMODIUM) 2 MG capsule Take 1 capsule (2 mg total) by mouth 4 (four) times daily as needed for diarrhea or loose stools. 03/11/15   Tinnie Gens Nealy Karapetian, PA-C  ondansetron (ZOFRAN) 4 MG tablet Take 1 tablet (4 mg total) by mouth every 6 (six) hours. 03/11/15   Eyvonne Mechanic, PA-C   BP 96/73 mmHg  Pulse 78  Temp(Src) 98.5 F (36.9 C) (Oral)  Resp 21  SpO2 96%  LMP 03/11/2015 (Exact Date)   Physical Exam  Constitutional: She is oriented to person, place, and time. She appears well-developed and well-nourished.  HENT:  Head: Normocephalic and atraumatic.  Eyes: Pupils are equal, round, and reactive to light.  Neck: Normal range of motion. Neck supple. No JVD present. No tracheal deviation present. No thyromegaly present.  Cardiovascular: Normal rate, regular rhythm, normal heart sounds and intact distal pulses.  Exam reveals no gallop and no friction rub.   No murmur heard. Pulmonary/Chest: Effort normal and breath sounds normal. No stridor. No respiratory distress. She has no  wheezes. She has no rales. She exhibits no tenderness.  Abdominal: Soft. Bowel sounds are normal. She exhibits no distension and no mass. There is no tenderness. There is no rebound and no guarding.  Musculoskeletal: Normal range of motion.  Lymphadenopathy:    She has no cervical adenopathy.  Neurological: She is alert and oriented to person, place, and time. Coordination normal.  Skin: Skin is warm and dry. No rash noted. No erythema.  Psychiatric: She has a normal mood and affect. Her behavior is normal. Judgment and thought content normal.  Nursing note and vitals reviewed.   ED Course  Procedures (including critical care time) Labs Review Labs Reviewed  CBC WITH DIFFERENTIAL/PLATELET - Abnormal; Notable for the following:    WBC 17.7 (*)    Platelets 505 (*)    Neutro Abs 12.8 (*)    Monocytes Absolute 1.4 (*)    All other components within normal limits  COMPREHENSIVE METABOLIC PANEL - Abnormal; Notable for the following:    Potassium 3.3 (*)    Glucose, Bld 101 (*)    Total Protein 8.6 (*)    All other components within normal limits  LIPASE, BLOOD - Abnormal; Notable for the following:    Lipase 21 (*)    All other components within normal limits  POC URINE PREG, ED    Imaging Review No results found.   EKG Interpretation None      MDM   Final diagnoses:  Diarrhea    Labs: POC urine,  CBC, CMP, lipase- significant for WBC of 17.7, potassium at 3.3  Imaging: None indicated  Consults: None  Therapeutics: Xanax, Zofran, normal saline  Assessment: Diarrhea  Plan: Patient presents with nonbloody diarrhea 5 days. She has associated nausea, no vomiting, is able to eat and drink small amounts of food. Her labs showed an elevated white count today, no fever, slightly low potassium. Patient abdominal exam had no focal tenderness or significant pain. Discharged home with instructions to continue maintaining adequate by mouth intake, Imodium as needed for the  diarrhea, follow-up with primary care in 3 days for further evaluation and management. Strict return precautions given patient verbalizes understanding and agreement days plan and had no further questions or concerns.      Eyvonne Mechanic, PA-C 03/12/15 1554  Purvis Sheffield, MD 03/13/15 1002

## 2024-02-14 ENCOUNTER — Ambulatory Visit (HOSPITAL_BASED_OUTPATIENT_CLINIC_OR_DEPARTMENT_OTHER): Admitting: Student

## 2024-02-14 ENCOUNTER — Encounter (HOSPITAL_BASED_OUTPATIENT_CLINIC_OR_DEPARTMENT_OTHER): Payer: Self-pay | Admitting: Student

## 2024-02-14 ENCOUNTER — Ambulatory Visit (HOSPITAL_BASED_OUTPATIENT_CLINIC_OR_DEPARTMENT_OTHER)

## 2024-02-14 DIAGNOSIS — S90211A Contusion of right great toe with damage to nail, initial encounter: Secondary | ICD-10-CM | POA: Diagnosis not present

## 2024-02-14 DIAGNOSIS — M79674 Pain in right toe(s): Secondary | ICD-10-CM | POA: Diagnosis not present

## 2024-02-14 NOTE — Progress Notes (Signed)
 Chief Complaint: Right great toe pain     History of Present Illness:    [name] is a 43 y.o. female presenting to clinic today for evaluation of her right great toe.  She reports that about 5 days ago she dropped a heavy outdoor chair directly onto the top of her toe.  This immediately became swollen and blue in appearance around the toenail.  Today she notes continued pain in the toe that is mild to moderate in severity depending on activity level.  She does report some bruising under the toenail.  She has tried ice and ibuprofen thus far for treatment.  Patient is leaving on vacation tomorrow to the mountains.  No history of injury to this area.   Surgical History:   None  PMH/PSH/Family History/Social History/Meds/Allergies:    Past Medical History:  Diagnosis Date   Abdominal pain, other specified site    Calculus of gallbladder without mention of cholecystitis or obstruction    Past Surgical History:  Procedure Laterality Date   CESAREAN SECTION     x two   CHOLECYSTECTOMY  06/2011   Social History   Socioeconomic History   Marital status: Married    Spouse name: Not on file   Number of children: 2   Years of education: Not on file   Highest education level: Not on file  Occupational History   Occupation: Pharmacy Tech-part time    Employer: [employer]  Tobacco Use   Smoking status: Former    Current packs/day: 0.00    Types: Cigarettes    Quit date: 07/16/2006    Years since quitting: 17.5   Smokeless tobacco: Never  Substance and Sexual Activity   Alcohol use: No   Drug use: No   Sexual activity: Not on file  Other Topics Concern   Not on file  Social History Narrative   Not on file   Social Drivers of Health   Financial Resource Strain: Not on file  Food Insecurity: Not on file  Transportation Needs: Not on file  Physical Activity: Not on file  Stress: Not on file  Social Connections: Unknown  (02/07/2022)   [facility]   Social Network    Social Network: Not on file   Family History  Problem Relation Age of Onset   Cancer Neg Hx    Allergies  Allergen Reactions   Medroxyprogesterone Acetate     REACTION: for a while   Minocycline Hcl     REACTION: Allergic rash   Sulfamethoxazole-Trimethoprim    Current Outpatient Medications  Medication Sig Dispense Refill   ALPRAZolam  (XANAX ) 1 MG tablet Take 1 mg by mouth 3 (three) times daily.     Alum & Mag Hydroxide-Simeth (MAGIC MOUTHWASH W/LIDOCAINE ) SOLN Take 2-5 mLs by mouth 4 (four) times daily as needed. Gargel and swallow a small amount as needed for sore throat. (Patient not taking: Reported on 03/11/2015) 50 mL 0   amoxicillin  (AMOXIL ) 500 MG capsule Take 2 capsules (1,000 mg total) by mouth 2 (two) times daily. (Patient not taking: Reported on 03/11/2015) 40 capsule 0   amphetamine-dextroamphetamine (ADDERALL) 20 MG tablet Take 20 mg by mouth 2 (two) times daily.     FLUoxetine (PROZAC) 20 MG capsule Take 20 mg by mouth daily.     lamoTRIgine (LAMICTAL)  25 MG tablet Take 25 mg by mouth 2 (two) times daily.     levothyroxine  (SYNTHROID , LEVOTHROID) 50 MCG tablet Take 1 tablet (50 mcg total) by mouth daily. (Patient not taking: Reported on 03/11/2015) 30 tablet 2   loperamide  (IMODIUM ) 2 MG capsule Take 1 capsule (2 mg total) by mouth 4 (four) times daily as needed for diarrhea or loose stools. 12 capsule 0   ondansetron  (ZOFRAN ) 4 MG tablet Take 1 tablet (4 mg total) by mouth every 6 (six) hours. 12 tablet 0   No current facility-administered medications for this visit.   No results found.  Review of Systems:   A ROS was performed including pertinent positives and negatives as documented in the HPI.  Physical Exam :   Constitutional: NAD and appears stated age Neurological: Alert and oriented Psych: Appropriate affect and cooperative There were no vitals taken for this visit.   Comprehensive  Musculoskeletal Exam:    Exam of the right great toe does demonstrate evidence of a subungual hematoma without any evidence of bleeding exteriorly from the nailbed.  No separation of the nail is noted.  Active flexion extension of the IP joint of the toe is limited.  No surrounding erythema or ecchymosis outside of the nail.  Distal neurosensory exam intact.  Imaging:   Xray (right great toe 3 views): No evidence of acute fracture or dislocation   I personally reviewed and interpreted the radiographs.   Assessment:   43 y.o. female with evidence of a subungual hematoma of the right great toe due to an injury sustained 5 days ago.  X-rays taken today do not show any evidence of bony abnormality involving the toe.  Discussed that at this time I would recommend conservative management including a postop shoe for ambulation, ice, and continued pain management with Tylenol  and ibuprofen.  If she tolerates this well, this will maintain a sterile environment within the nailbed without increased risk of infection.  Discussed that should her symptoms worsen or she would like to have this manage further, would recommend following up with podiatry for potential trephination and/or further management.  Plan :    - Postop shoe provided today and return to clinic as needed     I personally saw and evaluated the patient, and participated in the management and treatment plan.  Sharrell Deck, PA-C Orthopedics

## 2024-07-22 DIAGNOSIS — Z1231 Encounter for screening mammogram for malignant neoplasm of breast: Secondary | ICD-10-CM | POA: Diagnosis not present

## 2024-07-22 DIAGNOSIS — R92323 Mammographic fibroglandular density, bilateral breasts: Secondary | ICD-10-CM | POA: Diagnosis not present

## 2024-07-28 ENCOUNTER — Encounter: Payer: Self-pay | Admitting: Radiology

## 2024-10-31 ENCOUNTER — Encounter: Payer: Self-pay | Admitting: Internal Medicine
